# Patient Record
Sex: Male | Born: 1970 | Race: White | Hispanic: No | Marital: Married | Smoking: Current some day smoker
Health system: Southern US, Community
[De-identification: ages and names within clinical notes are randomized; demographics above are authoritative.]

## PROBLEM LIST (undated history)

## (undated) DIAGNOSIS — I471 Supraventricular tachycardia, unspecified: Secondary | ICD-10-CM

## (undated) DIAGNOSIS — I10 Essential (primary) hypertension: Secondary | ICD-10-CM

## (undated) DIAGNOSIS — E785 Hyperlipidemia, unspecified: Secondary | ICD-10-CM

## (undated) HISTORY — PX: VASECTOMY: SHX75

---

## 2007-12-28 ENCOUNTER — Emergency Department (HOSPITAL_COMMUNITY): Admission: EM | Admit: 2007-12-28 | Discharge: 2007-12-28 | Payer: Self-pay | Admitting: Emergency Medicine

## 2011-02-19 ENCOUNTER — Other Ambulatory Visit: Payer: Self-pay | Admitting: Urology

## 2011-02-19 ENCOUNTER — Ambulatory Visit (HOSPITAL_BASED_OUTPATIENT_CLINIC_OR_DEPARTMENT_OTHER)
Admission: RE | Admit: 2011-02-19 | Discharge: 2011-02-19 | Disposition: A | Discharge status: Home / Self Care | Payer: PRIVATE HEALTH INSURANCE | Source: Ambulatory Visit | Attending: Urology | Admitting: Urology

## 2011-02-19 DIAGNOSIS — Z302 Encounter for sterilization: Secondary | ICD-10-CM | POA: Insufficient documentation

## 2011-02-19 DIAGNOSIS — Z01812 Encounter for preprocedural laboratory examination: Secondary | ICD-10-CM | POA: Insufficient documentation

## 2011-02-19 LAB — POCT HEMOGLOBIN-HEMACUE: Hemoglobin: 15.2 g/dL (ref 13.0–17.0)

## 2011-02-24 NOTE — Op Note (Signed)
  NAMEMYKELL, RAWL NO.:  [id number]  MEDICAL RECORD NO.:  [id number]  LOCATION:                                 FACILITY:  PHYSICIAN:  Tanish Sinkler I. Patsi Sears, M.D. DATE OF BIRTH:  DATE OF PROCEDURE: DATE OF DISCHARGE:                              OPERATIVE REPORT   PREOPERATIVE DIAGNOSIS:  Elective sterilization.  POSTOPERATIVE DIAGNOSIS:  Elective sterilization.  OPERATION:  Vasectomy.  SURGEON:  Savanah Bayles I. Patsi Sears, MD  ANESTHESIA:  General LMA.  PREOPERATION PREPARATION:  After appropriate preanesthesia, the patient was brought to the operating room, placed on the operating room in dorsal supine position where general LMA anesthesia was induced.  He remained in supine position, with his scrotum was shaved, prepped with Betadine solution and draped in usual fashion.  REVIEW OF HISTORY:  This 40 year old male has a history of 5 children, he and his wife desire vasectomy for more permanent form of birth control.  Exam in the office showed a difficult, but normal scrotal anatomy, with a very thick spermatic cord, the vas buried within the spermatic cord.  PROCEDURE IN DETAIL:  The vas was isolated bilaterally and a 1.5 cm incision was made over the vas.  Subcutaneous tissue was dissected, the vas was then isolated, doubly clamped, and a portion removed.  Each end was then cauterized and ligated with 3-0 Vicryl suture.  The wounds were closed in single layer with 5-0 Monocryl suture.  The patient tolerated procedure well.  Marcaine was injected into each wound and Dermabond was used to seal the wound.  Dressing was applied.  The patient was awakened and taken to recovery room in good condition.     Elmus Mathes I. Patsi Sears, M.D.     SIT/MEDQ  D:  02/19/2011  T:  02/19/2011  Job:  [id number]  Electronically Signed by Jethro Bolus M.D. on 02/24/2011 05:01:45 PM

## 2019-10-29 ENCOUNTER — Observation Stay (HOSPITAL_COMMUNITY)
Admission: EM | Admit: 2019-10-29 | Discharge: 2019-10-31 | Disposition: A | Discharge status: Home / Self Care | Payer: Managed Care, Other (non HMO) | Attending: Internal Medicine | Admitting: Internal Medicine

## 2019-10-29 ENCOUNTER — Emergency Department (HOSPITAL_COMMUNITY): Payer: Managed Care, Other (non HMO)

## 2019-10-29 ENCOUNTER — Other Ambulatory Visit: Payer: Self-pay

## 2019-10-29 DIAGNOSIS — I471 Supraventricular tachycardia, unspecified: Secondary | ICD-10-CM

## 2019-10-29 DIAGNOSIS — E1165 Type 2 diabetes mellitus with hyperglycemia: Secondary | ICD-10-CM | POA: Insufficient documentation

## 2019-10-29 DIAGNOSIS — F17290 Nicotine dependence, other tobacco product, uncomplicated: Secondary | ICD-10-CM | POA: Insufficient documentation

## 2019-10-29 DIAGNOSIS — Z6841 Body Mass Index (BMI) 40.0 and over, adult: Secondary | ICD-10-CM | POA: Insufficient documentation

## 2019-10-29 DIAGNOSIS — I119 Hypertensive heart disease without heart failure: Secondary | ICD-10-CM | POA: Diagnosis not present

## 2019-10-29 DIAGNOSIS — I2510 Atherosclerotic heart disease of native coronary artery without angina pectoris: Secondary | ICD-10-CM | POA: Diagnosis not present

## 2019-10-29 DIAGNOSIS — I4510 Unspecified right bundle-branch block: Secondary | ICD-10-CM | POA: Diagnosis not present

## 2019-10-29 DIAGNOSIS — Z7901 Long term (current) use of anticoagulants: Secondary | ICD-10-CM | POA: Diagnosis not present

## 2019-10-29 DIAGNOSIS — R0609 Other forms of dyspnea: Secondary | ICD-10-CM | POA: Diagnosis present

## 2019-10-29 DIAGNOSIS — Z833 Family history of diabetes mellitus: Secondary | ICD-10-CM | POA: Diagnosis not present

## 2019-10-29 DIAGNOSIS — R739 Hyperglycemia, unspecified: Secondary | ICD-10-CM | POA: Diagnosis present

## 2019-10-29 DIAGNOSIS — J9601 Acute respiratory failure with hypoxia: Secondary | ICD-10-CM | POA: Diagnosis not present

## 2019-10-29 DIAGNOSIS — Z20822 Contact with and (suspected) exposure to covid-19: Secondary | ICD-10-CM | POA: Insufficient documentation

## 2019-10-29 DIAGNOSIS — G4733 Obstructive sleep apnea (adult) (pediatric): Secondary | ICD-10-CM

## 2019-10-29 DIAGNOSIS — Z8249 Family history of ischemic heart disease and other diseases of the circulatory system: Secondary | ICD-10-CM | POA: Diagnosis not present

## 2019-10-29 DIAGNOSIS — R079 Chest pain, unspecified: Secondary | ICD-10-CM | POA: Diagnosis present

## 2019-10-29 DIAGNOSIS — Z7982 Long term (current) use of aspirin: Secondary | ICD-10-CM | POA: Insufficient documentation

## 2019-10-29 DIAGNOSIS — Z79899 Other long term (current) drug therapy: Secondary | ICD-10-CM | POA: Insufficient documentation

## 2019-10-29 HISTORY — DX: Supraventricular tachycardia, unspecified: I47.10

## 2019-10-29 HISTORY — DX: Supraventricular tachycardia: I47.1

## 2019-10-29 HISTORY — DX: Essential (primary) hypertension: I10

## 2019-10-29 LAB — BASIC METABOLIC PANEL
Anion gap: 13 (ref 5–15)
BUN: 17 mg/dL (ref 6–20)
CO2: 19 mmol/L — ABNORMAL LOW (ref 22–32)
Calcium: 9.2 mg/dL (ref 8.9–10.3)
Chloride: 106 mmol/L (ref 98–111)
Creatinine, Ser: 1.15 mg/dL (ref 0.61–1.24)
GFR calc Af Amer: >60 mL/min (ref >60)
GFR calc non Af Amer: >60 mL/min (ref >60)
Glucose, Bld: 241 mg/dL — ABNORMAL HIGH (ref 70–99)
Potassium: 3.4 mmol/L — ABNORMAL LOW (ref 3.5–5.1)
Sodium: 138 mmol/L (ref 135–145)

## 2019-10-29 LAB — CBC
HCT: 39.2 % (ref 39.0–52.0)
Hemoglobin: 13.5 g/dL (ref 13.0–17.0)
MCH: 31.3 pg (ref 26.0–34.0)
MCHC: 34.4 g/dL (ref 30.0–36.0)
MCV: 90.7 fL (ref 80.0–100.0)
Platelets: 239 10*3/uL (ref 150–400)
RBC: 4.32 MIL/uL (ref 4.22–5.81)
RDW: 13 % (ref 11.5–15.5)
WBC: 8.4 10*3/uL (ref 4.0–10.5)
nRBC: 0 % (ref 0.0–0.2)

## 2019-10-29 LAB — TROPONIN I (HIGH SENSITIVITY): Troponin I (High Sensitivity): 8 ng/L (ref <18)

## 2019-10-29 NOTE — ED Triage Notes (Addendum)
Pt c/o intermittent CP for "a little while". Was dx with a right bundle branch block. States the CP is worse today with increased shortness of breath.

## 2019-10-30 ENCOUNTER — Encounter (HOSPITAL_COMMUNITY): Payer: Self-pay | Admitting: Internal Medicine

## 2019-10-30 ENCOUNTER — Ambulatory Visit (HOSPITAL_BASED_OUTPATIENT_CLINIC_OR_DEPARTMENT_OTHER): Payer: Managed Care, Other (non HMO)

## 2019-10-30 DIAGNOSIS — R079 Chest pain, unspecified: Secondary | ICD-10-CM

## 2019-10-30 DIAGNOSIS — G4733 Obstructive sleep apnea (adult) (pediatric): Secondary | ICD-10-CM | POA: Diagnosis not present

## 2019-10-30 DIAGNOSIS — R0600 Dyspnea, unspecified: Secondary | ICD-10-CM

## 2019-10-30 DIAGNOSIS — R0609 Other forms of dyspnea: Secondary | ICD-10-CM | POA: Diagnosis present

## 2019-10-30 DIAGNOSIS — R739 Hyperglycemia, unspecified: Secondary | ICD-10-CM

## 2019-10-30 DIAGNOSIS — I471 Supraventricular tachycardia: Secondary | ICD-10-CM | POA: Diagnosis not present

## 2019-10-30 DIAGNOSIS — I2510 Atherosclerotic heart disease of native coronary artery without angina pectoris: Secondary | ICD-10-CM

## 2019-10-30 HISTORY — PX: CARDIAC CATHETERIZATION: SHX172

## 2019-10-30 HISTORY — DX: Atherosclerotic heart disease of native coronary artery without angina pectoris: I25.10

## 2019-10-30 LAB — ECHOCARDIOGRAM COMPLETE
Height: 76 in
Weight: 6000 oz

## 2019-10-30 LAB — LIPID PANEL
Cholesterol: 146 mg/dL (ref 0–200)
HDL: 28 mg/dL — ABNORMAL LOW (ref >40)
LDL Cholesterol: 102 mg/dL — ABNORMAL HIGH (ref 0–99)
Total CHOL/HDL Ratio: 5.2 RATIO
Triglycerides: 81 mg/dL (ref <150)
VLDL: 16 mg/dL (ref 0–40)

## 2019-10-30 LAB — BRAIN NATRIURETIC PEPTIDE: B Natriuretic Peptide: 14.9 pg/mL (ref 0.0–100.0)

## 2019-10-30 LAB — CBC
HCT: 38.1 % — ABNORMAL LOW (ref 39.0–52.0)
Hemoglobin: 12.6 g/dL — ABNORMAL LOW (ref 13.0–17.0)
MCH: 30.7 pg (ref 26.0–34.0)
MCHC: 33.1 g/dL (ref 30.0–36.0)
MCV: 92.9 fL (ref 80.0–100.0)
Platelets: 192 10*3/uL (ref 150–400)
RBC: 4.1 MIL/uL — ABNORMAL LOW (ref 4.22–5.81)
RDW: 13 % (ref 11.5–15.5)
WBC: 7.4 10*3/uL (ref 4.0–10.5)
nRBC: 0 % (ref 0.0–0.2)

## 2019-10-30 LAB — GLUCOSE, CAPILLARY
Glucose-Capillary: 86 mg/dL (ref 70–99)
Glucose-Capillary: 137 mg/dL — ABNORMAL HIGH (ref 70–99)

## 2019-10-30 LAB — HIV ANTIBODY (ROUTINE TESTING W REFLEX): HIV Screen 4th Generation wRfx: NONREACTIVE

## 2019-10-30 LAB — CREATININE, SERUM
Creatinine, Ser: 0.98 mg/dL (ref 0.61–1.24)
GFR calc Af Amer: >60 mL/min (ref >60)
GFR calc non Af Amer: >60 mL/min (ref >60)

## 2019-10-30 LAB — HEMOGLOBIN A1C
Hgb A1c MFr Bld: 6 % — ABNORMAL HIGH (ref 4.8–5.6)
Mean Plasma Glucose: 125.5 mg/dL

## 2019-10-30 LAB — D-DIMER, QUANTITATIVE: D-Dimer, Quant: <0.27 ug/mL-FEU (ref 0.00–0.50)

## 2019-10-30 LAB — TROPONIN I (HIGH SENSITIVITY): Troponin I (High Sensitivity): 7 ng/L (ref <18)

## 2019-10-30 LAB — CBG MONITORING, ED
Glucose-Capillary: 106 mg/dL — ABNORMAL HIGH (ref 70–99)
Glucose-Capillary: 84 mg/dL (ref 70–99)

## 2019-10-30 LAB — POC SARS CORONAVIRUS 2 AG -  ED: SARS Coronavirus 2 Ag: NEGATIVE

## 2019-10-30 LAB — SARS CORONAVIRUS 2 (TAT 6-24 HRS): SARS Coronavirus 2: NEGATIVE

## 2019-10-30 MED ORDER — ACETAMINOPHEN 325 MG PO TABS: 650.0000 mg | ORAL_TABLET | ORAL | Status: DC | PRN

## 2019-10-30 MED ORDER — INSULIN ASPART 100 UNIT/ML ~~LOC~~ SOLN: 0.0000 [IU] | Freq: Every day | SUBCUTANEOUS | Status: DC

## 2019-10-30 MED ORDER — MULTIVITAMINS PO CAPS: 1.0000 | ORAL_CAPSULE | Freq: Every day | ORAL | Status: DC

## 2019-10-30 MED ORDER — METOPROLOL TARTRATE 25 MG PO TABS: 25.0000 mg | ORAL_TABLET | Freq: Two times a day (BID) | ORAL | Status: DC

## 2019-10-30 MED ORDER — LISINOPRIL 20 MG PO TABS
20.0000 mg | ORAL_TABLET | Freq: Every day | ORAL | Status: DC
  Administered 2019-10-30 – 2019-10-31 (×2): 20 mg via ORAL
  Filled 2019-10-30 (×2): qty 1

## 2019-10-30 MED ORDER — ENOXAPARIN SODIUM 40 MG/0.4ML ~~LOC~~ SOLN
40.0000 mg | SUBCUTANEOUS | Status: DC
  Administered 2019-10-30: 40 mg via SUBCUTANEOUS
  Filled 2019-10-30: qty 0.4

## 2019-10-30 MED ORDER — DICLOFENAC SODIUM 75 MG PO TBEC: 75.0000 mg | DELAYED_RELEASE_TABLET | Freq: Two times a day (BID) | ORAL | Status: DC | PRN

## 2019-10-30 MED ORDER — IBUPROFEN 400 MG PO TABS: 400.0000 mg | ORAL_TABLET | Freq: Four times a day (QID) | ORAL | Status: DC | PRN

## 2019-10-30 MED ORDER — METOPROLOL TARTRATE 25 MG PO TABS
25.0000 mg | ORAL_TABLET | Freq: Two times a day (BID) | ORAL | Status: AC
  Administered 2019-10-30 – 2019-10-31 (×2): 25 mg via ORAL
  Filled 2019-10-30 (×2): qty 1

## 2019-10-30 MED ORDER — SODIUM CHLORIDE 0.9 % IV BOLUS
1000.0000 mL | Freq: Once | INTRAVENOUS | Status: AC
  Administered 2019-10-30: 01:00:00 1000 mL via INTRAVENOUS

## 2019-10-30 MED ORDER — INSULIN ASPART 100 UNIT/ML ~~LOC~~ SOLN: 0.0000 [IU] | Freq: Three times a day (TID) | SUBCUTANEOUS | Status: DC

## 2019-10-30 MED ORDER — ONDANSETRON HCL 4 MG/2ML IJ SOLN: 4.0000 mg | Freq: Four times a day (QID) | INTRAMUSCULAR | Status: DC | PRN

## 2019-10-30 MED ORDER — ASPIRIN-ACETAMINOPHEN-CAFFEINE 250-250-65 MG PO TABS
1.0000 | ORAL_TABLET | Freq: Four times a day (QID) | ORAL | Status: DC | PRN
  Filled 2019-10-30: qty 1

## 2019-10-30 MED ORDER — ADULT MULTIVITAMIN W/MINERALS CH
1.0000 | ORAL_TABLET | Freq: Every day | ORAL | Status: DC
  Administered 2019-10-30 – 2019-10-31 (×2): 1 via ORAL
  Filled 2019-10-30 (×2): qty 1

## 2019-10-30 MED ORDER — HYDROCHLOROTHIAZIDE 25 MG PO TABS
25.0000 mg | ORAL_TABLET | Freq: Every day | ORAL | Status: DC
  Administered 2019-10-30 – 2019-10-31 (×2): 25 mg via ORAL
  Filled 2019-10-30 (×2): qty 1

## 2019-10-30 MED ORDER — LISINOPRIL-HYDROCHLOROTHIAZIDE 20-25 MG PO TABS: 1.0000 | ORAL_TABLET | Freq: Every day | ORAL | Status: DC

## 2019-10-30 MED ORDER — AMLODIPINE BESYLATE 5 MG PO TABS
5.0000 mg | ORAL_TABLET | Freq: Every day | ORAL | Status: DC
  Administered 2019-10-30 – 2019-10-31 (×2): 5 mg via ORAL
  Filled 2019-10-30 (×2): qty 1

## 2019-10-30 MED ORDER — HEPARIN SODIUM (PORCINE) 5000 UNIT/ML IJ SOLN
5000.0000 [IU] | Freq: Three times a day (TID) | INTRAMUSCULAR | Status: DC
  Administered 2019-10-30: 5000 [IU] via SUBCUTANEOUS
  Filled 2019-10-30: qty 1

## 2019-10-30 MED ORDER — NITROGLYCERIN 0.4 MG SL SUBL: 0.4000 mg | SUBLINGUAL_TABLET | SUBLINGUAL | Status: DC | PRN

## 2019-10-30 NOTE — ED Notes (Signed)
Dr Elesa Massed informed pt POC covid test neg

## 2019-10-30 NOTE — Progress Notes (Signed)
PROGRESS NOTE    Edward Obrien  XFG:[id number] DOB: 16-Jan-1971 DOA: 10/29/2019 PCP: Eartha Inch, MD    Brief Narrative:  Patient admitted with the working diagnosis of chest pain and dyspnea to rule out acute coronary disease.   49 year old male who presented with chest pain and dyspnea on exertion.  He does have significant past medical history for hypertension, obesity and obstructive sleep apnea. Recently diagnosed with SVT. Patient reported worsening of precordial chest pain, pressure-like in nature, worse with exertion, improved with rest.  Lately has have been at rest as well.  His chest pain is associated with dyspnea, diaphoresis and nausea.  Patient was seen as an outpatient and scheduled to have outpatient stress test ([facility]). On his initial physical examination his temperature 98.4, blood pressure 106/66, heart rate 80, respiratory 24, oxygen saturation 98%.  Heart S1-S2 present, rhythmic, lungs clear to auscultation, abdomen soft, no lower extremity edema.  Sodium 138, potassium 3.4, chloride 106, bicarb 19, glucose 241, creatinine 1.15, troponin I 8, white count 8.4, hemoglobin 13.5, hematocrit 39.2, platelets 239.  EKG 86 bpm, normal axis, right bundle branch block, manually QTc 487, sinus rhythm with PAC, no significant ST segment or T wave changes.   Assessment & Plan:   Principal Problem:   Chest pain Active Problems:   Exertional dyspnea   Hyperglycemia   1. Chest pain. Patient with dyspnea on exertion and precordial chest pain. His EKG has no active ischemic changes, (chronic right bundle branch block), old records personally reviewed, follows with cardiology at [facility], he has been diagnosed with SVT.   His troponin I are negative and d dimer is less than 0.27  Patient will benefit from stress test during this hospitalization, considering with worsening symptoms of dyspnea and chest pain. I have called cardiology for evaluation. Continue telemetry monitoring, he  will benefit from b blockade to prevent tachyarrhythmias.   Check lipid profile.   2. T2DM. Hgb A1c 6,0. Will continue glucose cover and monitoring with insulin sliding scale. Will advance his diet for now.  3. HTN. Continue amlodipine, lisinopril and Hctz for blood pressure control.   4, Obesity. OSA. Patient will need to continue outpatient follow up, Cpap therapy.     DVT prophylaxis: Enoxaparin  Code Status:   full  Family Communication:  I spoke with patient's wife at the bedside, we talked in detail about patient's condition, plan of care and prognosis and all questions were addressed.   Disposition Plan:   Patient is from:  Home   Anticipated DC to:  Home   Anticipated DC date:  04/27  Anticipated DC barriers: Possible stress test      Consultants:   Cardiology   Subjective: Patient with no chest pain, no nausea or vomiting, no dyspnea. Has been npo since admission.   Objective: Vitals:   10/30/19 1200 10/30/19 1230 10/30/19 1300 10/30/19 1330  BP: 116/68 113/68 125/74 111/68  Pulse: 72 73 68 62  Resp: 15 20 19 19   Temp:      TempSrc:      SpO2: 98% 98% 97% 90%  Weight:      Height:        Intake/Output Summary (Last 24 hours) at 10/30/2019 1459 Last data filed at 10/29/2019 2247 Gross per 24 hour  Intake --  Output 650 ml  Net -650 ml   Filed Weights   10/29/19 2234  Weight: (!) 170.1 kg    Examination:   General: Not in pain or dyspnea,  Neurology: Awake and alert, non focal  E ENT: no pallor, no icterus, oral mucosa moist Cardiovascular: No JVD. S1-S2 present, rhythmic, no gallops, rubs, or murmurs. No lower extremity edema. Pulmonary: positive breath sounds bilaterally, adequate air movement, no wheezing, rhonchi or rales. Gastrointestinal. Abdomen with no organomegaly, non tender, no rebound or guarding Skin. No rashes Musculoskeletal: no joint deformities     Data Reviewed: I have personally reviewed following labs and imaging  studies  CBC: Recent Labs  Lab 10/29/19 2200 10/30/19 0408  WBC 8.4 7.4  HGB 13.5 12.6*  HCT 39.2 38.1*  MCV 90.7 92.9  PLT 239 161   Basic Metabolic Panel: Recent Labs  Lab 10/29/19 2200 10/30/19 0408  NA 138  --   K 3.4*  --   CL 106  --   CO2 19*  --   GLUCOSE 241*  --   BUN 17  --   CREATININE 1.15 0.98  CALCIUM 9.2  --    GFR: Estimated Creatinine Clearance: 153.2 mL/min (by C-G formula based on SCr of 0.98 mg/dL). Liver Function Tests: No results for input(s): AST, ALT, ALKPHOS, BILITOT, PROT, ALBUMIN in the last 168 hours. No results for input(s): LIPASE, AMYLASE in the last 168 hours. No results for input(s): AMMONIA in the last 168 hours. Coagulation Profile: No results for input(s): INR, PROTIME in the last 168 hours. Cardiac Enzymes: No results for input(s): CKTOTAL, CKMB, CKMBINDEX, TROPONINI in the last 168 hours. BNP (last 3 results) No results for input(s): PROBNP in the last 8760 hours. HbA1C: Recent Labs    10/30/19 0611  HGBA1C 6.0*   CBG: Recent Labs  Lab 10/30/19 0758 10/30/19 1351  GLUCAP 106* 84   Lipid Profile: Recent Labs    10/30/19 0408  CHOL 146  HDL 28*  LDLCALC 102*  TRIG 81  CHOLHDL 5.2   Thyroid Function Tests: No results for input(s): TSH, T4TOTAL, FREET4, T3FREE, THYROIDAB in the last 72 hours. Anemia Panel: No results for input(s): VITAMINB12, FOLATE, FERRITIN, TIBC, IRON, RETICCTPCT in the last 72 hours.    Radiology Studies: I have reviewed all of the imaging during this hospital visit personally     Scheduled Meds: . heparin  5,000 Units Subcutaneous Q8H  . insulin aspart  0-5 Units Subcutaneous QHS  . insulin aspart  0-9 Units Subcutaneous TID WC   Continuous Infusions:   LOS: 0 days        Chloe Baig Gerome Apley, MD

## 2019-10-30 NOTE — ED Provider Notes (Signed)
[facility] EMERGENCY DEPARTMENT Provider Note   CSN: [id number] Arrival date & time: 10/29/19  2127     History Chief Complaint  Patient presents with  . Chest Pain    Edward Obrien is a 49 y.o. male with history of OSA, hypertension, obesity presents for evaluation of acute onset, progressively worsening shortness of breath for 3 days.  He has noted significant dyspnea on exertion especially over the last 2 days with minimal activity including with sitting on his lawnmower and going up a flight of steps.  He has noted sharp left-sided chest pains that will last for a few seconds at a time.  He has noted nausea, diaphoresis.  No aggravating or alleviating factors.  He reports that he is experienced similar chest pains previously maybe once or twice a month and typically associated with stress but states he has never experienced this chest pain multiple times in 1 day.  Wife is also noted a cough that has been nonproductive over the last 2 weeks.  No known sick contacts or Covid exposures.  No fevers.  He has a history of OSA and is and is in the process of being set up for CPAP.  She states that last night she noticed the patient "sounded like he was drowning" and had significant difficulty sleeping.  He denies leg swelling.  He infrequently smokes cigars, denies recreational drug use, no known family history of heart disease in first-degree relatives under the age of 55.  He was recently seen by cardiology through the [facility] system after it was noted that he had an arrhythmia while sedated for a colonoscopy.  He establish care with cardiology on 10/17/2019 and noted to have a right bundle blanch block.  He is in the process of being set up for an outpatient stress test and echocardiogram.  The history is provided by the patient.       No past medical history on file.  Patient Active Problem List   Diagnosis Date Noted  . Chest pain 10/30/2019       No family  history on file.  Social History   Tobacco Use  . Smoking status: Not on file  Substance Use Topics  . Alcohol use: Not on file  . Drug use: Not on file    Home Medications Prior to Admission medications   Not on File    Allergies    Patient has no known allergies.  Review of Systems   Review of Systems  Constitutional: Positive for diaphoresis and fatigue. Negative for chills and fever.  Respiratory: Positive for shortness of breath.   Cardiovascular: Positive for chest pain. Negative for leg swelling.  Gastrointestinal: Positive for nausea. Negative for abdominal pain and vomiting.  Neurological: Positive for light-headedness.  All other systems reviewed and are negative.   Physical Exam Updated Vital Signs BP (!) 146/118   Pulse 80   Temp 99.5 F (37.5 C) (Rectal)   Resp 18   Ht 6\' 4"  (1.93 m)   Wt (!) 170.1 kg   SpO2 97%   BMI 45.65 kg/m   Physical Exam Vitals and nursing note reviewed.  Constitutional:      General: He is not in acute distress.    Appearance: He is well-developed. He is obese.  HENT:     Head: Normocephalic and atraumatic.  Eyes:     General:        Right eye: No discharge.  Left eye: No discharge.     Conjunctiva/sclera: Conjunctivae normal.  Neck:     Vascular: No JVD.     Trachea: No tracheal deviation.  Cardiovascular:     Rate and Rhythm: Normal rate and regular rhythm.     Comments: 2+ radial and DP/PT pulses bilaterally, Homans sign absent bilaterally, no lower extremity edema, no palpable cords, compartments are soft  Pulmonary:     Effort: Tachypnea present.     Comments: Speaking in very short phrases.  Frequent nonproductive cough.  SPO2 saturations mostly 93-97% on room air with occasional desaturations down to the 80s while sitting upright.  Abdominal:     General: There is no distension.     Palpations: Abdomen is soft.     Tenderness: There is no abdominal tenderness.  Musculoskeletal:     Cervical back:  Normal range of motion and neck supple.     Right lower leg: No tenderness. No edema.     Left lower leg: No tenderness. No edema.  Skin:    General: Skin is warm and dry.     Findings: No erythema.  Neurological:     Mental Status: He is alert.  Psychiatric:        Behavior: Behavior normal.     ED Results / Procedures / Treatments   Labs (all labs ordered are listed, but only abnormal results are displayed) Labs Reviewed  BASIC METABOLIC PANEL - Abnormal; Notable for the following components:      Result Value   Potassium 3.4 (*)    CO2 19 (*)    Glucose, Bld 241 (*)    All other components within normal limits  SARS CORONAVIRUS 2 (TAT 6-24 HRS)  CBC  BRAIN NATRIURETIC PEPTIDE  D-DIMER, QUANTITATIVE (NOT AT [facility])  HIV ANTIBODY (ROUTINE TESTING W REFLEX)  CBC  CREATININE, SERUM  LIPID PANEL  POC SARS CORONAVIRUS 2 AG -  ED  TROPONIN I (HIGH SENSITIVITY)  TROPONIN I (HIGH SENSITIVITY)    EKG EKG Interpretation  Date/Time:  Sunday October 29 2019 21:41:47 EDT Ventricular Rate:  86 PR Interval:  172 QRS Duration: 122 QT Interval:  456 QTC Calculation: 545 R Axis:   106 Text Interpretation: Sinus rhythm with occasional Premature ventricular complexes Right bundle branch block Abnormal ECG No old tracing to compare Confirmed by Ward, Cyril Mourning [phone]) on 10/30/2019 12:04:29 AM   Radiology DG Chest 2 View  Result Date: 10/29/2019 CLINICAL DATA:  Chest pain EXAM: CHEST - 2 VIEW COMPARISON:  None. FINDINGS: The heart size and mediastinal contours are within normal limits. Both lungs are clear. The visualized skeletal structures are unremarkable. IMPRESSION: No active cardiopulmonary disease. Electronically Signed   By: Randa Ngo M.D.   On: 10/29/2019 22:23    Procedures .Critical Care Performed by: Renita Papa, PA-C Authorized by: Renita Papa, PA-C   Critical care provider statement:    Critical care time (minutes):  45   Critical care was necessary to treat  or prevent imminent or life-threatening deterioration of the following conditions:  Respiratory failure   Critical care was time spent personally by me on the following activities:  Discussions with consultants, evaluation of patient's response to treatment, examination of patient, ordering and performing treatments and interventions, ordering and review of laboratory studies, ordering and review of radiographic studies, pulse oximetry, re-evaluation of patient's condition, obtaining history from patient or surrogate and review of old charts   (including critical care time)  Medications Ordered in ED Medications  acetaminophen (TYLENOL) tablet 650 mg (has no administration in time range)  ondansetron (ZOFRAN) injection 4 mg (has no administration in time range)  heparin injection 5,000 Units (has no administration in time range)  sodium chloride 0.9 % bolus 1,000 mL (1,000 mLs Intravenous Bolus from Bag 10/30/19 0127)    ED Course  I have reviewed the triage vital signs and the nursing notes.  Pertinent labs & imaging results that were available during my care of the patient were reviewed by me and considered in my medical decision making (see chart for details).    MDM Rules/Calculators/A&P                      Edward Obrien was evaluated in Emergency Department on 10/30/2019 for the symptoms described in the history of present illness. He was evaluated in the context of the global COVID-19 pandemic, which necessitated consideration that the patient might be at risk for infection with the SARS-CoV-2 virus that causes COVID-19. Institutional protocols and algorithms that pertain to the evaluation of patients at risk for COVID-19 are in a state of rapid change based on information released by regulatory bodies including the CDC and federal and state organizations. These policies and algorithms were followed during the patient's care in the ED.  Patient presenting for evaluation of shortness of  breath, dyspnea on exertion, chest pains.  He is afebrile, tachypneic with visibly increased work of breathing on initial evaluation.  Lungs clear to auscultation bilaterally though somewhat diminished due to body habitus.  He will occasionally desaturate to the mid to low 80s while sitting upright.  He was placed on supplemental oxygen with improvement.   EKG shows right bundle branch block which was recently identified and evaluated by cardiology through the [place].  Serial troponins are negative.  Chest x-ray shows no acute cardiopulmonary abnormalities.  D-dimer is negative, reassuring that he likely does not have a PE.  Remainder of lab work reviewed by me shows no leukocytosis, no anemia, no renal insufficiency.  He is mildly hypokalemic and CO2 is also a little bit low.  He is hyperglycemic but no evidence of DKA.   Rectal temperature was obtained which was low-grade 99.5 F.  Point-of-care Covid test is negative.  He is vaccinated against Covid and has had no sick contacts.  He was ambulated in the ED and was noted to be lightheaded.  Orthostatic vital signs were obtained, with blood pressure 89/61 with standing.  Will give IV fluids and reassess.   With his transient hypoxia, new onset dyspnea on exertion, he would benefit from admission to the hospital.  Spoke with Dr. Lennette Bihari with [facility] hospitalist service who agrees to assume care of patient and bring him to the hospital for further evaluation and management. Patient seen and evaluated by Dr. Elesa Massed who agrees with assessment and plan at this time.    Final Clinical Impression(s) / ED Diagnoses Final diagnoses:  Acute respiratory failure with hypoxia [facility])    Rx / DC Orders ED Discharge Orders    None       Jeanie Sewer, PA-C 10/30/19 0405    Ward, Layla Maw, DO 10/30/19 0411

## 2019-10-30 NOTE — Progress Notes (Addendum)
Cardiology Consultation:   Patient ID: Edward Obrien MRN: [id number]; DOB: Dec 02, 1970  Admit date: 10/29/2019 Date of Consult: 10/30/2019  Primary Care Provider: Eartha Inch, MD Primary Cardiologist: No primary care provider on file. new here followed by Dr. Boneta Lucks with [facility]   Primary Electrophysiologist:  None    Patient Profile:   Edward Obrien is a 49 y.o. male with a hx of HTN, obesity, OSA, and new SVT who is being seen today for the evaluation of chest pain at the request of Dr. Ella Jubilee.  History of Present Illness:   Edward Obrien with above hx was seen by cardiology 10/17/19 after irregular rhythm on GI procedure.   EKG with RBBB, + tobacco use with cigars.  Was to have echo stress test but not yet done.    48 hr holter. Minimum heart rate 53 bpm.  Maximum heart rate 203 bpm, supraventricular tachycardia.  There were premature atrial contractions with an 8 beat run of supraventricular tachycardia at a rate of 203 bpm.  There were no pauses greater than 3 seconds in duration.  Now presents with chest pain and increasing DOE.  He had been having chest pain about twice a month but no several episodes in a day to 2 days.  Initially pain was with exertion now with rest.   He has not been set up for CPAP.  Admitted 10/29/19     Pain is sharp stabbing pain like a needle sticking in chest. Occurred rarely but last 2 days frequently  More than the chest pain is DOE, he has been walking up to 3 miles but over last few days cannot do much.  Walking up stairs he had to stop to catch breath, + significant diaphoresis.  His wife states he is strong and does lot of work but this past week has been unable to do much.   He has been waking from sleep gasping for hour.   Echo here EF 55-60% G1 DD RV size mildly enlarged.  Mild dilatation or the aortic root. At 40 mm  EKG:  The EKG was personally reviewed and demonstrates:  SR with RBBB and Rt axis  Telemetry:  Telemetry was personally  reviewed and demonstrates:  No telemetry - I ordered. Na 138, K+ 3.4 BUN 17, Cr 1.15  Troponin 8 and 7  BNP 14.9 t chol. 146, HDL 28, LDL 102  Hgb 12.6 and Hct 38 ddimer <0.27  hgb A1c 6 COVID neg  2 V cxr No active cardiopulmonary disease.  Currently resting comfortably.  He and his wife are both concerned about change in his activity level.  The oxygen has helped.   Past Medical History:  Diagnosis Date  . HTN (hypertension)   . SVT (supraventricular tachycardia) (HCC)      Home Medications:  Prior to Admission medications   Medication Sig Start Date End Date Taking? Authorizing Provider  amLODipine (NORVASC) 5 MG tablet Take 5 mg by mouth daily. 10/18/19  Yes [provider]  aspirin-acetaminophen-caffeine (EXCEDRIN MIGRAINE) [phone] MG tablet Take 1 tablet by mouth every 6 (six) hours as needed for headache.   Yes [provider]  diclofenac (VOLTAREN) 75 MG EC tablet Take 75 mg by mouth 2 (two) times daily as needed for pain. 03/15/19 03/14/20 Yes [provider]  ibuprofen (ADVIL) 200 MG tablet Take 400 mg by mouth every 6 (six) hours as needed for moderate pain.   Yes [provider]  lisinopril-hydrochlorothiazide (ZESTORETIC) 20-25 MG tablet Take  1 tablet by mouth daily. 10/18/19  Yes [provider]  Multiple Vitamin (MULTIVITAMIN) capsule Take 1 capsule by mouth daily.   Yes [provider]    Inpatient Medications: Scheduled Meds: . amLODipine  5 mg Oral Daily  . enoxaparin (LOVENOX) injection  40 mg Subcutaneous Q24H  . lisinopril  20 mg Oral Daily   And  . hydrochlorothiazide  25 mg Oral Daily  . insulin aspart  0-5 Units Subcutaneous QHS  . insulin aspart  0-9 Units Subcutaneous TID WC  . multivitamin with minerals  1 tablet Oral Daily   Continuous Infusions:   PRN Meds: acetaminophen, aspirin-acetaminophen-caffeine, ibuprofen, nitroGLYCERIN, ondansetron (ZOFRAN) IV  Allergies:   No Known  Allergies  Social History:   Social History   Socioeconomic History  . Marital status: Married    Spouse name: Not on file  . Number of children: Not on file  . Years of education: Not on file  . Highest education level: Not on file  Occupational History  . Not on file  Tobacco Use  . Smoking status: Current Every Day Smoker    Types: Cigars  Substance and Sexual Activity  . Alcohol use: Not on file  . Drug use: Not on file  . Sexual activity: Not on file  Other Topics Concern  . Not on file  Social History Narrative  . Not on file   Social Determinants of Health   Financial Resource Strain:   . Difficulty of Paying Living Expenses:   Food Insecurity:   . Worried About Programme researcher, broadcasting/film/videounning Out of Food in the Last Year:   . Baristaan Out of Food in the Last Year:   Transportation Needs:   . Freight forwarderLack of Transportation (Medical):   Marland Kitchen. Lack of Transportation (Non-Medical):   Physical Activity:   . Days of Exercise per Week:   . Minutes of Exercise per Session:   Stress:   . Feeling of Stress :   Social Connections:   . Frequency of Communication with Friends and Family:   . Frequency of Social Gatherings with Friends and Family:   . Attends Religious Services:   . Active Member of Clubs or Organizations:   . Attends BankerClub or Organization Meetings:   Marland Kitchen. Marital Status:   Intimate Partner Violence:   . Fear of Current or Ex-Partner:   . Emotionally Abused:   Marland Kitchen. Physically Abused:   . Sexually Abused:     Family History:    Family History  Problem Relation Age of Onset  . Alzheimer's disease Mother   . Hypertension Mother   . Diabetes Mother   . Failure to thrive Father      ROS:  Please see the history of present illness.  General:no colds or fevers, no weight changes Skin:no rashes or ulcers HEENT:no blurred vision, no congestion CV:see HPI PUL:see HPI GI:no diarrhea constipation or melena, no indigestion GU:no hematuria, no dysuria MS:no joint pain, no claudication Neuro:no  syncope, no lightheadedness Endo:no diabetes, no thyroid disease  All other ROS reviewed and negative.     Physical Exam/Data:   Vitals:   10/30/19 1500 10/30/19 1530 10/30/19 1600 10/30/19 1636  BP: 124/72 121/73 113/72 117/88  Pulse: (!) 59 67 63 61  Resp: (!) 21 12 19 18   Temp:    97.6 F (36.4 C)  TempSrc:    Oral  SpO2: 96% 100% 97% 100%  Weight:      Height:        Intake/Output Summary (Last  24 hours) at 10/30/2019 1758 Last data filed at 10/29/2019 2247 Gross per 24 hour  Intake --  Output 650 ml  Net -650 ml   Last 3 Weights 10/29/2019  Weight (lbs) 375 lb  Weight (kg) 170.099 kg     Body mass index is 45.65 kg/m.  General:  Well nourished, well developed, in no acute distress  HEENT: normal Lymph: no adenopathy Neck: no JVD Endocrine:  No thryomegaly Vascular: No carotid bruits; pedal pulses 2+ bilaterally   Cardiac:  normal S1, S2; RRR; no murmur gallup rub or click Lungs:  clear to auscultation bilaterally, no wheezing, rhonchi or rales  Abd: soft, nontender, no hepatomegaly  Ext: no edema Musculoskeletal:  No deformities, BUE and BLE strength normal and equal Skin: warm and dry  Neuro:  Alert and oriented X 3 MAE follows commands, no focal abnormalities noted Psych:  Normal affect    Relevant CV Studies: Monitor see above  Laboratory Data:  High Sensitivity Troponin:   Recent Labs  Lab 10/29/19 2200 10/29/19 2348  TROPONINIHS 8 7     Chemistry Recent Labs  Lab 10/29/19 2200 10/30/19 0408  NA 138  --   K 3.4*  --   CL 106  --   CO2 19*  --   GLUCOSE 241*  --   BUN 17  --   CREATININE 1.15 0.98  CALCIUM 9.2  --   GFRNONAA >60 >60  GFRAA >60 >60  ANIONGAP 13  --     No results for input(s): PROT, ALBUMIN, AST, ALT, ALKPHOS, BILITOT in the last 168 hours. Hematology Recent Labs  Lab 10/29/19 2200 10/30/19 0408  WBC 8.4 7.4  RBC 4.32 4.10*  HGB 13.5 12.6*  HCT 39.2 38.1*  MCV 90.7 92.9  MCH 31.3 30.7  MCHC 34.4 33.1  RDW  13.0 13.0  PLT 239 192   BNP Recent Labs  Lab 10/30/19 0015  BNP 14.9    DDimer  Recent Labs  Lab 10/30/19 0015  DDIMER <0.27     Radiology/Studies:  DG Chest 2 View  Result Date: 10/29/2019 CLINICAL DATA:  Chest pain EXAM: CHEST - 2 VIEW COMPARISON:  None. FINDINGS: The heart size and mediastinal contours are within normal limits. Both lungs are clear. The visualized skeletal structures are unremarkable. IMPRESSION: No active cardiopulmonary disease. Electronically Signed   By: Sharlet Salina M.D.   On: 10/29/2019 22:23   ECHOCARDIOGRAM COMPLETE  Result Date: 10/30/2019    ECHOCARDIOGRAM REPORT   Patient Name:   Edward Obrien Date of Exam: 10/30/2019 Medical Rec #:  [id number]       Height:       76.0 in Accession #:    [id number]      Weight:       375.0 lb Date of Birth:  [birth year]        BSA:          2.894 m Patient Age:    48 years        BP:           108/69 mmHg Patient Gender: M               HR:           62 bpm. Exam Location:  Inpatient Procedure: 2D Echo, Cardiac Doppler and Color Doppler Indications:    R07.9* Chest pain, unspecified  History:        Patient has no prior history of Echocardiogram examinations.  Sonographer:  Elmarie Shiley Dance Referring Phys: [id number] [facility]  Sonographer Comments: Technically difficult study due to poor echo windows. IMPRESSIONS  1. Left ventricular ejection fraction, by estimation, is 55 to 60%. The left ventricle has normal function. Left ventricular endocardial border not optimally defined to evaluate regional wall motion. Left ventricular diastolic parameters are consistent with Grade I diastolic dysfunction (impaired relaxation).  2. Right ventricular systolic function is normal. The right ventricular size is mildly enlarged. Tricuspid regurgitation signal is inadequate for assessing PA pressure.  3. The mitral valve is normal in structure. Trivial mitral valve regurgitation. No evidence of mitral stenosis.  4. The aortic valve was not  well visualized. Aortic valve regurgitation is not visualized. No aortic stenosis is present.  5. Aortic dilatation noted. There is mild dilatation of the aortic root measuring 40 mm.  6. The inferior vena cava is dilated in size with <50% respiratory variability, suggesting right atrial pressure of 15 mmHg. FINDINGS  Left Ventricle: LV wall thickness not well visualized for measurement, appears grossly normal in other views. Left ventricular ejection fraction, by estimation, is 55 to 60%. The left ventricle has normal function. Left ventricular endocardial border not optimally defined to evaluate regional wall motion. The left ventricular internal cavity size was normal in size. Left ventricular diastolic parameters are consistent with Grade I diastolic dysfunction (impaired relaxation). Right Ventricle: The right ventricular size is mildly enlarged. No increase in right ventricular wall thickness. Right ventricular systolic function is normal. Tricuspid regurgitation signal is inadequate for assessing PA pressure. Left Atrium: Left atrial size was normal in size. Right Atrium: Right atrial size was normal in size. Pericardium: There is no evidence of pericardial effusion. Presence of pericardial fat pad. Mitral Valve: The mitral valve is normal in structure. Normal mobility of the mitral valve leaflets. Trivial mitral valve regurgitation. No evidence of mitral valve stenosis. Tricuspid Valve: The tricuspid valve is normal in structure. Tricuspid valve regurgitation is not demonstrated. No evidence of tricuspid stenosis. Aortic Valve: The aortic valve was not well visualized. Aortic valve regurgitation is not visualized. No aortic stenosis is present. Pulmonic Valve: The pulmonic valve was not well visualized. Pulmonic valve regurgitation is trivial. No evidence of pulmonic stenosis. Aorta: The aortic root was not well visualized and aortic dilatation noted. There is mild dilatation of the aortic root measuring 40  mm. Venous: The inferior vena cava was not well visualized. The inferior vena cava is dilated in size with less than 50% respiratory variability, suggesting right atrial pressure of 15 mmHg. IAS/Shunts: The interatrial septum was not well visualized.  LEFT VENTRICLE PLAX 2D LVIDd:         5.13 cm  Diastology LVIDs:         4.58 cm  LV e' lateral:   11.10 cm/s LVOT diam:     2.40 cm  LV E/e' lateral: 5.5 LV SV:         79       LV e' medial:    6.64 cm/s LV SV Index:   27       LV E/e' medial:  9.3 LVOT Area:     4.52 cm  RIGHT VENTRICLE             IVC RV Basal diam:  3.86 cm     IVC diam: 2.54 cm RV Mid diam:    2.47 cm RV S prime:     12.10 cm/s TAPSE (M-mode): 2.1 cm LEFT ATRIUM  Index       RIGHT ATRIUM           Index LA diam:        4.10 cm 1.42 cm/m  RA Area:     25.00 cm LA Vol (A2C):   69.5 ml 24.02 ml/m RA Volume:   76.70 ml  26.51 ml/m LA Vol (A4C):   44.1 ml 15.24 ml/m LA Biplane Vol: 56.5 ml 19.53 ml/m  AORTIC VALVE LVOT Vmax:   85.00 cm/s LVOT Vmean:  54.500 cm/s LVOT VTI:    0.175 m  AORTA Ao Root diam: 4.00 cm Ao Asc diam:  3.40 cm MITRAL VALVE MV Area (PHT): 2.73 cm    SHUNTS MV Decel Time: 278 msec    Systemic VTI:  0.18 m MV E velocity: 61.60 cm/s  Systemic Diam: 2.40 cm MV A velocity: 67.40 cm/s MV E/A ratio:  0.91 Cherlynn Kaiser MD Electronically signed by Cherlynn Kaiser MD Signature Date/Time: 10/30/2019/4:25:55 PM    Final         No pain currently - atypical Assessment and Plan:   1. Chest pan with more freq episodes and occurs now at rest.  --was to have exercise echo but not yet done.  He did wear 48 hr holter with SVt though not sure if arrhythmia occurs with pain.  neg troponin  In 2014 he was given NSAIDS for chest wall pain.   Echo see above, will plan cardiac CTA in AM  Add lopressor 25 BID for test.  2. HTN on amlodipine and zestoretic at home  3. SVT on holter monitor  4. OSA not yet on CPAP  5. Tobacco use cigars, have recommended he stop.         For questions or updates, please contact [place] Please consult [email or web address] for contact info under    Signed, Cecilie Kicks, NP  10/30/2019 5:58 PM  I have seen and examined the patient along with Cecilie Kicks, NP , PA NP.  I have reviewed the chart, notes and new data.  I agree with PA/NP's note.  Key new complaints: has definite symptoms of OSA (witnessed apnea, restless sleep with sudden awakening and choking, loud snoring, daytime fatigue/hypersomnolence). Also describes more recent symptoms of CHF (PND, exertional and supine cough, ankle edema). Atypical chest pain. Key examination changes: severely obese, which limits the CV exam; mild ankle edema, otw normal CV exam Key new findings / data: normal LVEF, dilated right heart chambers, minimal signs of diastolic dysfunction on echo.  PLAN: Coronary CT angio to assess for CAD. Echo image quality is fair, but doubt stress echo will generate easily interpretable images. Similarly, nuclear perfusion may be difficult to interpret due to body habitus. Needs to f/u w CPAP titration for OSA. OSA related pulmonary HTN may explain many of his complaints and the proclivity to SVT.   Sanda Klein, MD, [place] [phone] 10/30/2019, 9:12 PM

## 2019-10-30 NOTE — H&P (Signed)
History and Physical    PASQUAL FARIAS NLZ:[id number] DOB: 01/24/1971 DOA: 10/29/2019  PCP: Eartha Inch, MD (Confirm with patient/family/NH records and if not entered, this has to be entered at [facility] point of entry)   Patient coming from: Home     I have personally briefly reviewed patient's old medical records in [facility]  Chief Complaint: Chest pain and dyspnea with exertion  HPI: [name] is a 49 y.o. male with medical history significant of hypertension, obesity and sleep apnea presented to ED for evaluation of chest pain and worsening dyspnea with exertion.  Patient states that he started having episodic chest pain for many months but it was usually happening once or twice a month but now for the last few days he started having multiple episodes of chest pain daily.  Chest pain is usually located in the center of chest, pressured-like sensation, nonradiating, occasionally gets better with rest and get worse with exertion.  Chest pain is also happening even at rest nowadays.  Patient states that his dyspnea is also worsening and and now it comes with few steps.  Patient also complaining of having nausea and diaphoresis during the chest pain.  Patient further reported that he has a history of obstructive sleep apnea and in process of being set up for CPAP.  Patient denies any recent injury to the chest or recent travel history.  It was reported by patient's wife that he was seen by cardiology recently and noted to have a right bundle branch block.  Patient is set up for outpatient stress test and echocardiogram but his chest pain is more frequent with worsening dyspnea that Friday came to the hospital for further evaluation today.  Patient admits of using cigars occasionally.  Patient otherwise denies fever, chills, headache, dizziness, vomiting, abdominal pain, diarrhea and edema of lower extremities.  ED Course: On arrival to the hospital patient had temperature of 98.4,  blood pressure 106/66, heart rate 80, respiratory rate 24 and oxygen saturation 98% on room air.  Blood work showed WBC 8.4, hemoglobin 13.5, BUN 17, creatinine 1.15, blood glucose 241.  BNP is 14.9 while 2 sets of troponin negative.  D-dimer is less than 0.27.  X-ray chest is negative for acute cardiopulmonary disease.  EKG showed normal sinus rhythm with premature ventricular complexes and right bundle branch block.  Patient is started on 2 L of oxygen with nasal cannula for comfort and chest pain is resolved at this time.  Review of Systems: As per HPI otherwise 10 point review of systems negative.  Unacceptable ROS statements: "10 systems reviewed," "Extensive" (without elaboration).  Acceptable ROS statements: "All others negative," "All others reviewed and are negative," and "All others unremarkable," with at LEAST ONE ROS documented Can't double dip - if using for HPI can't use for ROS  No past medical history on file.  Hypertension, obstructive sleep apnea, obesity   has no history on file for tobacco, alcohol, and drug.  Patient admits of using occasional cigar  No Known Allergies  NKDA  No family history on file. Patient has no family history of cardiac disease Unacceptable: Noncontributory, unremarkable, or negative. Acceptable: (example)Family history negative for heart disease  Prior to Admission medications   Not on File    Physical Exam: Vitals:   10/30/19 0230 10/30/19 0315 10/30/19 0345 10/30/19 0415  BP: 98/63 (!) 146/118 109/73 99/63  Pulse: 78 80 72 71  Resp: 17 18 (!) 21 (!) 21  Temp:  TempSrc:      SpO2: 98% 97% 97% 97%  Weight:      Height:        Constitutional: NAD, calm, comfortable Vitals:   10/30/19 0230 10/30/19 0315 10/30/19 0345 10/30/19 0415  BP: 98/63 (!) 146/118 109/73 99/63  Pulse: 78 80 72 71  Resp: 17 18 (!) 21 (!) 21  Temp:      TempSrc:      SpO2: 98% 97% 97% 97%  Weight:      Height:        General: Obese Caucasian male  laying comfortably in the bed and in no acute distress Eyes: PERRL, lids and conjunctivae normal ENMT: Mucous membranes are moist. Posterior pharynx clear of any exudate or lesions.Normal dentition.  Neck: normal, supple, no masses, no thyromegaly Respiratory: clear to auscultation bilaterally, no wheezing, no crackles. Normal respiratory effort. No accessory muscle use.  Cardiovascular: Chest nontender on palpation.  Regular rate and rhythm, no murmurs / rubs / gallops. No extremity edema. 2+ pedal pulses. No carotid bruits.  Abdomen: no tenderness, no masses palpated. No hepatosplenomegaly. Bowel sounds positive.  Musculoskeletal: no clubbing / cyanosis. No joint deformity upper and lower extremities. Good ROM, no contractures. Normal muscle tone.  Skin: no rashes, lesions, ulcers. No induration Neurologic: CN 2-12 grossly intact. Sensation intact, DTR normal. Strength 5/5 in all 4.  Psychiatric: Normal judgment and insight. Alert and oriented x 3. Normal mood.   (Anything < 9 systems with 2 bullets each down codes to level 1) (If patient refuses exam can't bill higher level) (Make sure to document decubitus ulcers present on admission -- if possible -- and whether patient has chronic indwelling catheter at time of admission)  Labs on Admission: I have personally reviewed following labs and imaging studies  CBC: Recent Labs  Lab 10/29/19 2200  WBC 8.4  HGB 13.5  HCT 39.2  MCV 90.7  PLT 239   Basic Metabolic Panel: Recent Labs  Lab 10/29/19 2200  NA 138  K 3.4*  CL 106  CO2 19*  GLUCOSE 241*  BUN 17  CREATININE 1.15  CALCIUM 9.2   GFR: Estimated Creatinine Clearance: 130.5 mL/min (by C-G formula based on SCr of 1.15 mg/dL). Liver Function Tests: No results for input(s): AST, ALT, ALKPHOS, BILITOT, PROT, ALBUMIN in the last 168 hours. No results for input(s): LIPASE, AMYLASE in the last 168 hours. No results for input(s): AMMONIA in the last 168 hours. Coagulation  Profile: No results for input(s): INR, PROTIME in the last 168 hours. Cardiac Enzymes: No results for input(s): CKTOTAL, CKMB, CKMBINDEX, TROPONINI in the last 168 hours. BNP (last 3 results) No results for input(s): PROBNP in the last 8760 hours. HbA1C: No results for input(s): HGBA1C in the last 72 hours. CBG: No results for input(s): GLUCAP in the last 168 hours. Lipid Profile: No results for input(s): CHOL, HDL, LDLCALC, TRIG, CHOLHDL, LDLDIRECT in the last 72 hours. Thyroid Function Tests: No results for input(s): TSH, T4TOTAL, FREET4, T3FREE, THYROIDAB in the last 72 hours. Anemia Panel: No results for input(s): VITAMINB12, FOLATE, FERRITIN, TIBC, IRON, RETICCTPCT in the last 72 hours. Urine analysis: No results found for: COLORURINE, APPEARANCEUR, LABSPEC, PHURINE, GLUCOSEU, HGBUR, BILIRUBINUR, KETONESUR, PROTEINUR, UROBILINOGEN, NITRITE, LEUKOCYTESUR  Radiological Exams on Admission: DG Chest 2 View  Result Date: 10/29/2019 CLINICAL DATA:  Chest pain EXAM: CHEST - 2 VIEW COMPARISON:  None. FINDINGS: The heart size and mediastinal contours are within normal limits. Both lungs are clear. The visualized skeletal structures  are unremarkable. IMPRESSION: No active cardiopulmonary disease. Electronically Signed   By: Randa Ngo M.D.   On: 10/29/2019 22:23      Assessment/Plan Principal Problem:   Chest pain Patient is most probably having unstable angina. Patient admitted for observation to cardiac telemetry. Cardiology consult ordered for evaluation and possible stress test Cardiogram ordered Troponin and EKG negative. D-dimer within normal limits. Nitro glycine sublingual as needed for chest pain. Oxygen supplementation with nasal cannula as needed. Lipid panel ordered. Hemoglobin A1c ordered. N.p.o.  Active Problems:   Exertional dyspnea Although work-up including chest x-ray and BNP negative. Echocardiogram ordered. Oxygen supplementation with nasal cannula as  needed.    Hyperglycemia Low-dose sliding scale insulin ordered. Blood glucose monitoring and hypoglycemic protocol in place. Hemoglobin A1c    DVT prophylaxis: Heparin Code Status: Full code Family Communication: Patient's wife is present at the bedside Disposition Plan: Patient will be discharged to home once cleared by cardiology Consults called: Cardiology Admission status: Observation/telemetry cardiac   Edmonia Lynch MD [facility] Pager [phone]   If 7PM-7AM, please contact night-coverage [email or web address] Password   10/30/2019, 4:18 AM

## 2019-10-30 NOTE — ED Notes (Signed)
Patient ambulated with 96-100% on RA c/o being lightheaded

## 2019-10-30 NOTE — Progress Notes (Signed)
  Echocardiogram 2D Echocardiogram has been performed.  Edward Obrien 10/30/2019, 10:38 AM

## 2019-10-31 ENCOUNTER — Observation Stay (HOSPITAL_COMMUNITY): Payer: Managed Care, Other (non HMO)

## 2019-10-31 DIAGNOSIS — R931 Abnormal findings on diagnostic imaging of heart and coronary circulation: Secondary | ICD-10-CM

## 2019-10-31 DIAGNOSIS — R079 Chest pain, unspecified: Secondary | ICD-10-CM | POA: Diagnosis not present

## 2019-10-31 DIAGNOSIS — R0600 Dyspnea, unspecified: Secondary | ICD-10-CM | POA: Diagnosis not present

## 2019-10-31 DIAGNOSIS — I2510 Atherosclerotic heart disease of native coronary artery without angina pectoris: Secondary | ICD-10-CM

## 2019-10-31 DIAGNOSIS — G4733 Obstructive sleep apnea (adult) (pediatric): Secondary | ICD-10-CM | POA: Diagnosis not present

## 2019-10-31 DIAGNOSIS — I2781 Cor pulmonale (chronic): Secondary | ICD-10-CM | POA: Diagnosis not present

## 2019-10-31 DIAGNOSIS — R739 Hyperglycemia, unspecified: Secondary | ICD-10-CM | POA: Diagnosis not present

## 2019-10-31 LAB — LIPID PANEL
Cholesterol: 167 mg/dL (ref 0–200)
HDL: 28 mg/dL — ABNORMAL LOW (ref >40)
LDL Cholesterol: 102 mg/dL — ABNORMAL HIGH (ref 0–99)
Total CHOL/HDL Ratio: 6 RATIO
Triglycerides: 184 mg/dL — ABNORMAL HIGH (ref <150)
VLDL: 37 mg/dL (ref 0–40)

## 2019-10-31 LAB — BASIC METABOLIC PANEL
Anion gap: 9 (ref 5–15)
BUN: 15 mg/dL (ref 6–20)
CO2: 26 mmol/L (ref 22–32)
Calcium: 9.1 mg/dL (ref 8.9–10.3)
Chloride: 104 mmol/L (ref 98–111)
Creatinine, Ser: 0.99 mg/dL (ref 0.61–1.24)
GFR calc Af Amer: >60 mL/min (ref >60)
GFR calc non Af Amer: >60 mL/min (ref >60)
Glucose, Bld: 105 mg/dL — ABNORMAL HIGH (ref 70–99)
Potassium: 4.1 mmol/L (ref 3.5–5.1)
Sodium: 139 mmol/L (ref 135–145)

## 2019-10-31 LAB — GLUCOSE, CAPILLARY
Glucose-Capillary: 108 mg/dL — ABNORMAL HIGH (ref 70–99)
Glucose-Capillary: 95 mg/dL (ref 70–99)

## 2019-10-31 MED ORDER — NITROGLYCERIN 0.4 MG SL SUBL: 0.4000 mg | SUBLINGUAL_TABLET | SUBLINGUAL | 0 refills | Status: AC | PRN

## 2019-10-31 MED ORDER — ASPIRIN EC 81 MG PO TBEC: 81.0000 mg | DELAYED_RELEASE_TABLET | Freq: Every day | ORAL | 2 refills | Status: AC

## 2019-10-31 MED ORDER — PANTOPRAZOLE SODIUM 40 MG PO TBEC: 40.0000 mg | DELAYED_RELEASE_TABLET | Freq: Every day | ORAL | 1 refills | Status: DC

## 2019-10-31 MED ORDER — NITROGLYCERIN 0.4 MG SL SUBL
SUBLINGUAL_TABLET | SUBLINGUAL | Status: AC
  Administered 2019-10-31: 12:00:00 0.8 mg via SUBLINGUAL
  Filled 2019-10-31: qty 2

## 2019-10-31 MED ORDER — METOPROLOL TARTRATE 25 MG PO TABS: 25.0000 mg | ORAL_TABLET | Freq: Once | ORAL | Status: DC

## 2019-10-31 MED ORDER — IOHEXOL 350 MG/ML SOLN
100.0000 mL | Freq: Once | INTRAVENOUS | Status: AC | PRN
  Administered 2019-10-31: 100 mL via INTRAVENOUS

## 2019-10-31 NOTE — Discharge Summary (Signed)
Physician Discharge Summary  Edward Obrien QQV:[id number] DOB: [birth year] DOA: 10/29/2019  PCP: Eartha Inch, MD  Admit date: 10/29/2019 Discharge date: 10/31/2019  Admitted From: Home  Disposition:  Home   Recommendations for Outpatient Follow-up and new medication changes:  1. Follow up with Dr. Cyndia Bent in 7 days 2. Patient placed on pantoprazole daily  3. Added as needed nitroglycerin for chest pain. 4. Added 81 mg aspirin 5. Patient would like to start life style modification before starting on statin therapy.  6. Consider to start patient on b blockade.   Home Health: no   Equipment/Devices: no    Discharge Condition: stable  CODE STATUS: full  Diet recommendation: heart healthy   Brief/Interim Summary:  Patient admitted with the working diagnosis of chest pain and dyspnea to rule out acute coronary disease.   49 year old male who presented with chest pain and dyspnea on exertion.  He does have significant past medical history for hypertension, obesity and obstructive sleep apnea. Recently diagnosed with SVT. Patient reported worsening of precordial chest pain, pressure-like in nature, worse with exertion, improved with rest.  Lately has have been at rest as well.  His chest pain is associated with dyspnea, diaphoresis and nausea.  Patient was seen as an outpatient and scheduled to have outpatient stress test ([facility]). On his initial physical examination his temperature 98.4, blood pressure 106/66, heart rate 80, respiratory 24, oxygen saturation 98%.  Heart S1-S2 present, rhythmic, lungs clear to auscultation, abdomen soft, no lower extremity edema.  Sodium 138, potassium 3.4, chloride 106, bicarb 19, glucose 241, creatinine 1.15, troponin I 8, white count 8.4, hemoglobin 13.5, hematocrit 39.2, platelets 239.  EKG 86 bpm, normal axis, right bundle branch block, manually QTc 487, sinus rhythm with PAC, no significant ST segment or T wave changes.   1.  Chest pain.  Patient was  admitted to the telemetry ward, his cardiac enzymes remain negative.  Patient was seen by cardiology, and underwent a coronary CT angiography.  It resulted in mild to moderate stenosis of the proximal and mid LAD.  With recommendations to continue symptom guided anti-ischemic pharmacotherapy as well as aggressive risk factor modification per guideline directed care.  Patient will continue blood pressure control, lifestyle modifications before starting a statin therapy.  Aspirin 81 mg daily has been added along with as needed nitroglycerin.  Patient will follow up with his cardiologist as an outpatient.  2.  Prediabetes mellitus.  Patient's hemoglobin A1c 6.0.  Patient was advised about lifestyle modifications.  3.  Hypertension.  Continue blood pressure control with amlodipine, lisinopril and hydrochlorothiazide.  4.  Obesity/obstructive sleep apnea.  Patient will follow-up as an outpatient, for CPAP arrangement, continue aggressive lifestyle modifications.  Discharge Diagnoses:  Principal Problem:   Chest pain Active Problems:   Exertional dyspnea   Hyperglycemia   OSA (obstructive sleep apnea)   Morbid obesity (HCC)   SVT (supraventricular tachycardia) (HCC)    Discharge Instructions  Discharge Instructions    Diet - low sodium heart healthy   Complete by: As directed    Discharge instructions   Complete by: As directed    Please follow with primary care in 7 days.   Increase activity slowly   Complete by: As directed      Allergies as of 10/31/2019   No Known Allergies     Medication List    TAKE these medications   amLODipine 5 MG tablet Commonly known as: NORVASC Take 5 mg by mouth daily.  aspirin-acetaminophen-caffeine 250-250-65 MG tablet Commonly known as: EXCEDRIN MIGRAINE Take 1 tablet by mouth every 6 (six) hours as needed for headache.   diclofenac 75 MG EC tablet Commonly known as: VOLTAREN Take 75 mg by mouth 2 (two) times daily as needed for pain.    ibuprofen 200 MG tablet Commonly known as: ADVIL Take 400 mg by mouth every 6 (six) hours as needed for moderate pain.   lisinopril-hydrochlorothiazide 20-25 MG tablet Commonly known as: ZESTORETIC Take 1 tablet by mouth daily.   multivitamin capsule Take 1 capsule by mouth daily.   nitroGLYCERIN 0.4 MG SL tablet Commonly known as: NITROSTAT Place 1 tablet (0.4 mg total) under the tongue every 5 (five) minutes as needed for chest pain.   pantoprazole 40 MG tablet Commonly known as: Protonix Take 1 tablet (40 mg total) by mouth daily.      Follow-up Information    Rhinehart, Olga Coaster, MD. Schedule an appointment as soon as possible for a visit.   Specialty: Cardiology Why: See in 3-4 weeks. Keep appt for sleep apnea, CPAP. Contact information: [address] [place] [address] [phone]          No Known Allergies  Consultations: Cardiology   Procedures/Studies: DG Chest 2 View  Result Date: 10/29/2019 CLINICAL DATA:  Chest pain EXAM: CHEST - 2 VIEW COMPARISON:  None. FINDINGS: The heart size and mediastinal contours are within normal limits. Both lungs are clear. The visualized skeletal structures are unremarkable. IMPRESSION: No active cardiopulmonary disease. Electronically Signed   By: Sharlet Salina M.D.   On: 10/29/2019 22:23   CT CORONARY MORPH W/CTA COR W/SCORE W/CA W/CM &/OR WO/CM  Addendum Date: 10/31/2019   ADDENDUM REPORT: 10/31/2019 13:30 CLINICAL DATA:  48 year old male with morbid obesity, hyperlipidemia and chest pain. EXAM: Cardiac/Coronary  CTA TECHNIQUE: The patient was scanned on a [employer]. FINDINGS: A 100 kV prospective scan was triggered in the descending thoracic aorta at 111 HU's. Axial non-contrast 3 mm slices were carried out through the heart. The data set was analyzed on a dedicated work station and scored using the Agatson method. Gantry rotation speed was 250 msecs and collimation was .6 mm. No beta  blockade and 0.8 mg of sl NTG was given. The 3D data set was reconstructed in 5% intervals of the 67-82 % of the R-R cycle. Diastolic phases were analyzed on a dedicated work station using MPR, MIP and VRT modes. The patient received 80 cc of contrast. Aorta: Normal size. Minimal diffuse atherosclerotic plaque, no calcifications. No dissection. Aortic Valve:  Trileaflet.  No calcifications. Coronary Arteries:  Normal coronary origin.  Right dominance. RCA is a large dominant artery that gives rise to PDA and PLA. There are minimal luminal irregularities. Left main is a large artery that gives rise to LAD and LCX arteries. Left main has no disease. LAD is a large vessel that gives rise to a small diagonal artery. Proximal and mid LAD has mild to moderate diffuse mixed plaque with stenosis 25-49% and possibly 50-69% in the mid portion. Distal LAD has no significant disease. LCX is a small non-dominant artery that has no obvious plaque. Other findings: Normal pulmonary vein drainage into the left atrium. Normal left atrial appendage without a thrombus. IMPRESSION: 1. Coronary calcium score of 56. This was 17 percentile for age and sex matched control. 2. Normal coronary origin with right dominance. 3. Study quality and interpretation affected by patient's size. CAD-RADS 3. Mild to moderate stenosis in the  proximal and mid LAD. Consider symptom-guided anti-ischemic pharmacotherapy as well as aggressive risk factor modification per guideline directed care. 4. Mildly dilated pulmonary artery suggestive of pulmonary hypertension. Electronically Signed   By: Tobias AlexanderKatarina  Nelson   On: 10/31/2019 13:30   Result Date: 10/31/2019 EXAM: OVER-READ INTERPRETATION  CT CHEST The following report is an over-read performed by radiologist Dr. Jeronimo [facility]avesKyle Talbot of [facility] on 10/31/2019. This over-read does not include interpretation of cardiac or coronary anatomy or pathology. The coronary CTA interpretation by the cardiologist  is attached. COMPARISON:  Chest radiograph 10/29/2019 FINDINGS: Vascular: Normal aortic caliber. No dissection. No central pulmonary embolism, on this non-dedicated study. Mediastinum/Nodes: No imaged thoracic adenopathy. The esophagus is mildly dilated with a fluid level within, including on 42/8. Lungs/Pleura: No pleural fluid. Clear imaged lungs. Upper Abdomen: Normal imaged portions of the liver, spleen, stomach. Musculoskeletal: No acute osseous abnormality. IMPRESSION: 1. No acute process in the chest. 2. Esophageal air fluid level suggests dysmotility or gastroesophageal reflux. Electronically Signed: By: Jeronimo GreavesKyle  Talbot M.D. On: 10/31/2019 12:18   ECHOCARDIOGRAM COMPLETE  Result Date: 10/30/2019    ECHOCARDIOGRAM REPORT   Patient Name:   Edward Obrien Date of Exam: 10/30/2019 Medical Rec #:  [id number]       Height:       76.0 in Accession #:    [id number]      Weight:       375.0 lb Date of Birth:  07/03/1971        BSA:          2.894 m Patient Age:    48 years        BP:           108/69 mmHg Patient Gender: M               HR:           62 bpm. Exam Location:  Inpatient Procedure: 2D Echo, Cardiac Doppler and Color Doppler Indications:    R07.9* Chest pain, unspecified  History:        Patient has no prior history of Echocardiogram examinations.  Sonographer:    Elmarie Shileyiffany Dance Referring Phys: [id number] [facility] Welton FlakesKHAN  Sonographer Comments: Technically difficult study due to poor echo windows. IMPRESSIONS  1. Left ventricular ejection fraction, by estimation, is 55 to 60%. The left ventricle has normal function. Left ventricular endocardial border not optimally defined to evaluate regional wall motion. Left ventricular diastolic parameters are consistent with Grade I diastolic dysfunction (impaired relaxation).  2. Right ventricular systolic function is normal. The right ventricular size is mildly enlarged. Tricuspid regurgitation signal is inadequate for assessing PA pressure.  3. The mitral valve is  normal in structure. Trivial mitral valve regurgitation. No evidence of mitral stenosis.  4. The aortic valve was not well visualized. Aortic valve regurgitation is not visualized. No aortic stenosis is present.  5. Aortic dilatation noted. There is mild dilatation of the aortic root measuring 40 mm.  6. The inferior vena cava is dilated in size with <50% respiratory variability, suggesting right atrial pressure of 15 mmHg. FINDINGS  Left Ventricle: LV wall thickness not well visualized for measurement, appears grossly normal in other views. Left ventricular ejection fraction, by estimation, is 55 to 60%. The left ventricle has normal function. Left ventricular endocardial border not optimally defined to evaluate regional wall motion. The left ventricular internal cavity size was normal in size. Left ventricular diastolic parameters are consistent with Grade I diastolic dysfunction (  impaired relaxation). Right Ventricle: The right ventricular size is mildly enlarged. No increase in right ventricular wall thickness. Right ventricular systolic function is normal. Tricuspid regurgitation signal is inadequate for assessing PA pressure. Left Atrium: Left atrial size was normal in size. Right Atrium: Right atrial size was normal in size. Pericardium: There is no evidence of pericardial effusion. Presence of pericardial fat pad. Mitral Valve: The mitral valve is normal in structure. Normal mobility of the mitral valve leaflets. Trivial mitral valve regurgitation. No evidence of mitral valve stenosis. Tricuspid Valve: The tricuspid valve is normal in structure. Tricuspid valve regurgitation is not demonstrated. No evidence of tricuspid stenosis. Aortic Valve: The aortic valve was not well visualized. Aortic valve regurgitation is not visualized. No aortic stenosis is present. Pulmonic Valve: The pulmonic valve was not well visualized. Pulmonic valve regurgitation is trivial. No evidence of pulmonic stenosis. Aorta: The  aortic root was not well visualized and aortic dilatation noted. There is mild dilatation of the aortic root measuring 40 mm. Venous: The inferior vena cava was not well visualized. The inferior vena cava is dilated in size with less than 50% respiratory variability, suggesting right atrial pressure of 15 mmHg. IAS/Shunts: The interatrial septum was not well visualized.  LEFT VENTRICLE PLAX 2D LVIDd:         5.13 cm  Diastology LVIDs:         4.58 cm  LV e' lateral:   11.10 cm/s LVOT diam:     2.40 cm  LV E/e' lateral: 5.5 LV SV:         79       LV e' medial:    6.64 cm/s LV SV Index:   27       LV E/e' medial:  9.3 LVOT Area:     4.52 cm  RIGHT VENTRICLE             IVC RV Basal diam:  3.86 cm     IVC diam: 2.54 cm RV Mid diam:    2.47 cm RV S prime:     12.10 cm/s TAPSE (M-mode): 2.1 cm LEFT ATRIUM             Index       RIGHT ATRIUM           Index LA diam:        4.10 cm 1.42 cm/m  RA Area:     25.00 cm LA Vol (A2C):   69.5 ml 24.02 ml/m RA Volume:   76.70 ml  26.51 ml/m LA Vol (A4C):   44.1 ml 15.24 ml/m LA Biplane Vol: 56.5 ml 19.53 ml/m  AORTIC VALVE LVOT Vmax:   85.00 cm/s LVOT Vmean:  54.500 cm/s LVOT VTI:    0.175 m  AORTA Ao Root diam: 4.00 cm Ao Asc diam:  3.40 cm MITRAL VALVE MV Area (PHT): 2.73 cm    SHUNTS MV Decel Time: 278 msec    Systemic VTI:  0.18 m MV E velocity: 61.60 cm/s  Systemic Diam: 2.40 cm MV A velocity: 67.40 cm/s MV E/A ratio:  0.91 Cherlynn Kaiser MD Electronically signed by Cherlynn Kaiser MD Signature Date/Time: 10/30/2019/4:25:55 PM    Final         Subjective: Patient is feeling better, no further chest pain, no nausea or vomiting, no dyspnea.   Discharge Exam: Vitals:   10/31/19 1023 10/31/19 1259  BP: 114/70 110/75  Pulse:  (!) 59  Resp:  17  Temp:  97.6 F (36.4  C)  SpO2:  100%   Vitals:   10/31/19 0011 10/31/19 0558 10/31/19 1023 10/31/19 1259  BP: 114/73 123/90 114/70 110/75  Pulse: 64 72  (!) 59  Resp: Temp: 97.9 F (36.6 C) 98.1  F (36.7 C)  97.6 F (36.4 C)  TempSrc: Oral Oral  Oral  SpO2: 100% 97%  100%  Weight:      Height:        General: Not in pain or dyspnea,  Neurology: Awake and alert, non focal  E ENT: no pallor, no icterus, oral mucosa moist Cardiovascular: No JVD. S1-S2 present, rhythmic, no gallops, rubs, or murmurs. No lower extremity edema. Pulmonary: positive breath sounds bilaterally, adequate air movement, no wheezing, rhonchi or rales. Gastrointestinal. Abdomen with no organomegaly, non tender, no rebound or guarding Skin. No rashes Musculoskeletal: no joint deformities   The results of significant diagnostics from this hospitalization (including imaging, microbiology, ancillary and laboratory) are listed below for reference.     Microbiology: Recent Results (from the past 240 hour(s))  SARS CORONAVIRUS 2 (TAT 6-24 HRS) Nasopharyngeal Nasopharyngeal Swab     Status: None   Collection Time: 10/30/19  2:42 AM   Specimen: Nasopharyngeal Swab  Result Value Ref Range Status   SARS Coronavirus 2 NEGATIVE NEGATIVE Final    Comment: (NOTE) SARS-CoV-2 target nucleic acids are NOT DETECTED. The SARS-CoV-2 RNA is generally detectable in upper and lower respiratory specimens during the acute phase of infection. Negative results do not preclude SARS-CoV-2 infection, do not rule out co-infections with other pathogens, and should not be used as the sole basis for treatment or other patient management decisions. Negative results must be combined with clinical observations, patient history, and epidemiological information. The expected result is Negative. Fact Sheet for Patients: HairSlick.no Fact Sheet for Healthcare Providers: quierodirigir.com This test is not yet approved or cleared by the [place] FDA and  has been authorized for detection and/or diagnosis of SARS-CoV-2 by FDA under an Emergency Use Authorization (EUA). This EUA  will remain  in effect (meaning this test can be used) for the duration of the COVID-19 declaration under Section 56 4(b)(1) of the Act, 21 U.S.C. section 360bbb-3(b)(1), unless the authorization is terminated or revoked sooner. Performed at [facility], [address], [place], [place] [address]      Labs: BNP (last 3 results) Recent Labs    10/30/19 0015  BNP 14.9   Basic Metabolic Panel: Recent Labs  Lab 10/29/19 2200 10/30/19 0408 10/31/19 0246  NA 138  --  139  K 3.4*  --  4.1  CL 106  --  104  CO2 19*  --  26  GLUCOSE 241*  --  105*  BUN 17  --  15  CREATININE 1.15 0.98 0.99  CALCIUM 9.2  --  9.1   Liver Function Tests: No results for input(s): AST, ALT, ALKPHOS, BILITOT, PROT, ALBUMIN in the last 168 hours. No results for input(s): LIPASE, AMYLASE in the last 168 hours. No results for input(s): AMMONIA in the last 168 hours. CBC: Recent Labs  Lab 10/29/19 2200 10/30/19 0408  WBC 8.4 7.4  HGB 13.5 12.6*  HCT 39.2 38.1*  MCV 90.7 92.9  PLT 239 192   Cardiac Enzymes: No results for input(s): CKTOTAL, CKMB, CKMBINDEX, TROPONINI in the last 168 hours. BNP: Invalid input(s): POCBNP CBG: Recent Labs  Lab 10/30/19 1351 10/30/19 1639 10/30/19 2129 10/31/19 0633 10/31/19 1047  GLUCAP 84 86 137*  108* 95   D-Dimer Recent Labs    10/30/19 0015  DDIMER <0.27   Hgb A1c Recent Labs    10/30/19 0611  HGBA1C 6.0*   Lipid Profile Recent Labs    10/30/19 0408 10/31/19 0246  CHOL 146 167  HDL 28* 28*  LDLCALC 102* 656*  TRIG 81 184*  CHOLHDL 5.2 6.0   Thyroid function studies No results for input(s): TSH, T4TOTAL, T3FREE, THYROIDAB in the last 72 hours.  Invalid input(s): FREET3 Anemia work up No results for input(s): VITAMINB12, FOLATE, FERRITIN, TIBC, IRON, RETICCTPCT in the last 72 hours. Urinalysis No results found for: COLORURINE, APPEARANCEUR, LABSPEC, PHURINE, GLUCOSEU, HGBUR, BILIRUBINUR, KETONESUR, PROTEINUR, UROBILINOGEN,  NITRITE, LEUKOCYTESUR Sepsis Labs Invalid input(s): PROCALCITONIN,  WBC,  LACTICIDVEN Microbiology Recent Results (from the past 240 hour(s))  SARS CORONAVIRUS 2 (TAT 6-24 HRS) Nasopharyngeal Nasopharyngeal Swab     Status: None   Collection Time: 10/30/19  2:42 AM   Specimen: Nasopharyngeal Swab  Result Value Ref Range Status   SARS Coronavirus 2 NEGATIVE NEGATIVE Final    Comment: (NOTE) SARS-CoV-2 target nucleic acids are NOT DETECTED. The SARS-CoV-2 RNA is generally detectable in upper and lower respiratory specimens during the acute phase of infection. Negative results do not preclude SARS-CoV-2 infection, do not rule out co-infections with other pathogens, and should not be used as the sole basis for treatment or other patient management decisions. Negative results must be combined with clinical observations, patient history, and epidemiological information. The expected result is Negative. Fact Sheet for Patients: HairSlick.no Fact Sheet for Healthcare Providers: quierodirigir.com This test is not yet approved or cleared by the [place] FDA and  has been authorized for detection and/or diagnosis of SARS-CoV-2 by FDA under an Emergency Use Authorization (EUA). This EUA will remain  in effect (meaning this test can be used) for the duration of the COVID-19 declaration under Section 56 4(b)(1) of the Act, 21 U.S.C. section 360bbb-3(b)(1), unless the authorization is terminated or revoked sooner. Performed at [facility], [address], [place], [place] [address]      Time coordinating discharge: 45 minutes  SIGNED:   Coralie Keens, MD  [facility] 10/31/2019, 2:09 PM

## 2019-10-31 NOTE — Progress Notes (Addendum)
Progress Note  Patient Name: Edward Obrien Date of Encounter: 10/31/2019  Primary Cardiologist:  Penni Bombard, MD  Subjective   No SOB, has not ambulated enough to get DOE Pt and wife were working on getting healthier, eating better, decreasing cholesterol in foods, vegetarian since 03/01  Inpatient Medications    Scheduled Meds: . amLODipine  5 mg Oral Daily  . enoxaparin (LOVENOX) injection  40 mg Subcutaneous Q24H  . lisinopril  20 mg Oral Daily   And  . hydrochlorothiazide  25 mg Oral Daily  . insulin aspart  0-5 Units Subcutaneous QHS  . insulin aspart  0-9 Units Subcutaneous TID WC  . metoprolol tartrate  25 mg Oral Once  . multivitamin with minerals  1 tablet Oral Daily   Continuous Infusions:  PRN Meds: acetaminophen, aspirin-acetaminophen-caffeine, ibuprofen, nitroGLYCERIN, ondansetron (ZOFRAN) IV   Vital Signs    Vitals:   10/30/19 2229 10/31/19 0011 10/31/19 0558 10/31/19 1023  BP: 110/75 114/73 123/90 114/70  Pulse: 80 64 72   Resp: 17 16 16    Temp: 98 F (36.7 C) 97.9 F (36.6 C) 98.1 F (36.7 C)   TempSrc: Oral Oral Oral   SpO2: 98% 100% 97%   Weight:      Height:        Intake/Output Summary (Last 24 hours) at 10/31/2019 1219 Last data filed at 10/31/2019 11/02/2019 Gross per 24 hour  Intake 240 ml  Output 2050 ml  Net -1810 ml   Filed Weights   10/29/19 2234  Weight: (!) 170.1 kg   Last Weight  Most recent update: 10/29/2019 10:35 PM   Weight  170.1 kg (375 lb)             Weight change:    Telemetry    SR, occ PVCs - Personally Reviewed  ECG    None today - Personally Reviewed  Physical Exam   General: Well developed, well nourished, male appearing in no acute distress. Head: Normocephalic, atraumatic.  Neck: Supple without bruits, JVD not seen elevated (much facial hair). Lungs:  Resp regular and unlabored, CTA. Heart: RRR, S1, S2, no S3, S4, or murmur; no rub. Abdomen: Soft, non-tender, non-distended with  normoactive bowel sounds. No hepatomegaly. No rebound/guarding. No obvious abdominal masses. Extremities: No clubbing, cyanosis, no edema. Distal pedal pulses are 2+ bilaterally. Neuro: Alert and oriented X 3. Moves all extremities spontaneously. Psych: Normal affect.  Labs    Hematology Recent Labs  Lab 10/29/19 2200 10/30/19 0408  WBC 8.4 7.4  RBC 4.32 4.10*  HGB 13.5 12.6*  HCT 39.2 38.1*  MCV 90.7 92.9  MCH 31.3 30.7  MCHC 34.4 33.1  RDW 13.0 13.0  PLT 239 192    Chemistry Recent Labs  Lab 10/29/19 2200 10/30/19 0408 10/31/19 0246  NA 138  --  139  K 3.4*  --  4.1  CL 106  --  104  CO2 19*  --  26  GLUCOSE 241*  --  105*  BUN 17  --  15  CREATININE 1.15 0.98 0.99  CALCIUM 9.2  --  9.1  GFRNONAA >60 >60 >60  GFRAA >60 >60 >60  ANIONGAP 13  --  9     High Sensitivity Troponin:   Recent Labs  Lab 10/29/19 2200 10/29/19 2348  TROPONINIHS 8 7      BNP Recent Labs  Lab 10/30/19 0015  BNP 14.9     DDimer  Recent Labs  Lab 10/30/19 0015  DDIMER <  0.27    Lab Results  Component Value Date   HGBA1C 6.0 (H) 10/30/2019     Radiology    DG Chest 2 View  Result Date: 10/29/2019 CLINICAL DATA:  Chest pain EXAM: CHEST - 2 VIEW COMPARISON:  None. FINDINGS: The heart size and mediastinal contours are within normal limits. Both lungs are clear. The visualized skeletal structures are unremarkable. IMPRESSION: No active cardiopulmonary disease. Electronically Signed   By: Sharlet Salina M.D.   On: 10/29/2019 22:23   ECHOCARDIOGRAM COMPLETE  Result Date: 10/30/2019    ECHOCARDIOGRAM REPORT   Patient Name:   [name] Date of Exam: 10/30/2019 Medical Rec #:  [id number]       Height:       76.0 in Accession #:    [id number]      Weight:       375.0 lb Date of Birth:  [birth year]        BSA:          2.894 m Patient Age:    49 years        BP:           108/69 mmHg Patient Gender: M               HR:           62 bpm. Exam Location:  Inpatient Procedure: 2D  Echo, Cardiac Doppler and Color Doppler Indications:    R07.9* Chest pain, unspecified  History:        Patient has no prior history of Echocardiogram examinations.  Sonographer:    Elmarie Shiley Dance Referring Phys: [id number] [facility] Z Welton Flakes  Sonographer Comments: Technically difficult study due to poor echo windows. IMPRESSIONS  1. Left ventricular ejection fraction, by estimation, is 55 to 60%. The left ventricle has normal function. Left ventricular endocardial border not optimally defined to evaluate regional wall motion. Left ventricular diastolic parameters are consistent with Grade I diastolic dysfunction (impaired relaxation).  2. Right ventricular systolic function is normal. The right ventricular size is mildly enlarged. Tricuspid regurgitation signal is inadequate for assessing PA pressure.  3. The mitral valve is normal in structure. Trivial mitral valve regurgitation. No evidence of mitral stenosis.  4. The aortic valve was not well visualized. Aortic valve regurgitation is not visualized. No aortic stenosis is present.  5. Aortic dilatation noted. There is mild dilatation of the aortic root measuring 40 mm.  6. The inferior vena cava is dilated in size with <50% respiratory variability, suggesting right atrial pressure of 15 mmHg. FINDINGS  Left Ventricle: LV wall thickness not well visualized for measurement, appears grossly normal in other views. Left ventricular ejection fraction, by estimation, is 55 to 60%. The left ventricle has normal function. Left ventricular endocardial border not optimally defined to evaluate regional wall motion. The left ventricular internal cavity size was normal in size. Left ventricular diastolic parameters are consistent with Grade I diastolic dysfunction (impaired relaxation). Right Ventricle: The right ventricular size is mildly enlarged. No increase in right ventricular wall thickness. Right ventricular systolic function is normal. Tricuspid regurgitation signal is  inadequate for assessing PA pressure. Left Atrium: Left atrial size was normal in size. Right Atrium: Right atrial size was normal in size. Pericardium: There is no evidence of pericardial effusion. Presence of pericardial fat pad. Mitral Valve: The mitral valve is normal in structure. Normal mobility of the mitral valve leaflets. Trivial mitral valve regurgitation. No evidence of mitral valve stenosis. Tricuspid Valve: The tricuspid valve  is normal in structure. Tricuspid valve regurgitation is not demonstrated. No evidence of tricuspid stenosis. Aortic Valve: The aortic valve was not well visualized. Aortic valve regurgitation is not visualized. No aortic stenosis is present. Pulmonic Valve: The pulmonic valve was not well visualized. Pulmonic valve regurgitation is trivial. No evidence of pulmonic stenosis. Aorta: The aortic root was not well visualized and aortic dilatation noted. There is mild dilatation of the aortic root measuring 40 mm. Venous: The inferior vena cava was not well visualized. The inferior vena cava is dilated in size with less than 50% respiratory variability, suggesting right atrial pressure of 15 mmHg. IAS/Shunts: The interatrial septum was not well visualized.  LEFT VENTRICLE PLAX 2D LVIDd:         5.13 cm  Diastology LVIDs:         4.58 cm  LV e' lateral:   11.10 cm/s LVOT diam:     2.40 cm  LV E/e' lateral: 5.5 LV SV:         79       LV e' medial:    6.64 cm/s LV SV Index:   27       LV E/e' medial:  9.3 LVOT Area:     4.52 cm  RIGHT VENTRICLE             IVC RV Basal diam:  3.86 cm     IVC diam: 2.54 cm RV Mid diam:    2.47 cm RV S prime:     12.10 cm/s TAPSE (M-mode): 2.1 cm LEFT ATRIUM             Index       RIGHT ATRIUM           Index LA diam:        4.10 cm 1.42 cm/m  RA Area:     25.00 cm LA Vol (A2C):   69.5 ml 24.02 ml/m RA Volume:   76.70 ml  26.51 ml/m LA Vol (A4C):   44.1 ml 15.24 ml/m LA Biplane Vol: 56.5 ml 19.53 ml/m  AORTIC VALVE LVOT Vmax:   85.00 cm/s LVOT  Vmean:  54.500 cm/s LVOT VTI:    0.175 m  AORTA Ao Root diam: 4.00 cm Ao Asc diam:  3.40 cm MITRAL VALVE MV Area (PHT): 2.73 cm    SHUNTS MV Decel Time: 278 msec    Systemic VTI:  0.18 m MV E velocity: 61.60 cm/s  Systemic Diam: 2.40 cm MV A velocity: 67.40 cm/s MV E/A ratio:  0.91 Cherlynn Kaiser MD Electronically signed by Cherlynn Kaiser MD Signature Date/Time: 10/30/2019/4:25:55 PM    Final      Cardiac Studies   ECHO:  10/30/2019 1. Left ventricular ejection fraction, by estimation, is 55 to 60%. The  left ventricle has normal function. Left ventricular endocardial border  not optimally defined to evaluate regional wall motion. Left ventricular  diastolic parameters are consistent  with Grade I diastolic dysfunction (impaired relaxation).  2. Right ventricular systolic function is normal. The right ventricular  size is mildly enlarged. Tricuspid regurgitation signal is inadequate for  assessing PA pressure.  3. The mitral valve is normal in structure. Trivial mitral valve  regurgitation. No evidence of mitral stenosis.  4. The aortic valve was not well visualized. Aortic valve regurgitation  is not visualized. No aortic stenosis is present.  5. Aortic dilatation noted. There is mild dilatation of the aortic root  measuring 40 mm.  6. The inferior vena cava is  dilated in size with <50% respiratory  variability, suggesting right atrial pressure of 15 mmHg.   Patient Profile     49 y.o. male w/ hx palpitations and CP (Holter>>SVT and stress echo by Dr Boneta Lucks), HTN, OSA, obesity, was admitted 04/26 with CP, DOE.   Assessment & Plan    1. Chest pain - ez neg MI - ECG not acute - s/p cardiac CT>>results pending  2. DOE - BNP normal, d-dimer negative - see echo, TR signal inadequate for PA pressure, RA pressure 15 - CXR w/ NAD - BMI is 45 - MD to review data and advise further plan - may benefit from CPX, will leave to Dr Boneta Lucks  3. OSA - is to get fitted for  mask next month - has been sleeping very poorly  Otherwise, per IM Principal Problem:   Chest pain Active Problems:   Exertional dyspnea   Hyperglycemia   OSA (obstructive sleep apnea)   Morbid obesity (HCC)   SVT (supraventricular tachycardia) (HCC)    Signed, Theodore Demark , PA-C 12:19 PM 10/31/2019 Pager: [phone]  I have seen and examined the patient along with Theodore Demark, PAC.  I have reviewed the chart, notes and new data.  I agree with PA/NP's note.  Key new complaints: no active dyspnea or chest pain, occasional cough Key examination changes: obese, normal CV exam Key new findings / data: CT angio images reviewed and discussed with Dr. Delton See and the patient. Calcium score 83rd percentile, but no meaningful obstructive lesions. Note evidence of esophageal dysmotility reflux.   PLAN: Aggressive weight loss and increased exercise to help low HDL, which seems to be the biggest problem. Offered statin to reach LDL<70, but he would like to first see results of lifestyle changes and that is perfectly reasonable. Suspect dyspnea is due to untreated sleep apnea/pulmonary HTN. His RV is dilated. He has severe daytime hypersomnolence, has documented apneic events on home sleep study and is pending scheduling for CPAP titration. Weight loss is critical and he appears committed to it. Cough may be due to GERD. He does not have heartburn, but may benefit from trial of PPI. More importantly, elevated head of bead, avoid lying down after meals, smaller meal sizes, avoid chocolate/peppermint/carbonated beverages, etc. Refer to GI if symptoms persist.  Will schedule f/u and repeat lipids in 3 months.   Thurmon Fair, MD, [facility] [phone] 10/31/2019, 1:44 PM

## 2019-10-31 NOTE — Progress Notes (Signed)
Discharge teaching complete. Meds, diet, activity, follow up appointments reviewed and all questions answered. Prescriptions sent to pharmacy.  

## 2019-11-04 DIAGNOSIS — G4733 Obstructive sleep apnea (adult) (pediatric): Secondary | ICD-10-CM

## 2019-11-04 DIAGNOSIS — Z9989 Dependence on other enabling machines and devices: Secondary | ICD-10-CM

## 2019-11-04 HISTORY — DX: Obstructive sleep apnea (adult) (pediatric): G47.33

## 2019-11-04 HISTORY — DX: Dependence on other enabling machines and devices: Z99.89

## 2019-11-11 ENCOUNTER — Observation Stay (HOSPITAL_COMMUNITY)
Admission: EM | Admit: 2019-11-11 | Discharge: 2019-11-13 | Disposition: A | Discharge status: Home / Self Care | Payer: Managed Care, Other (non HMO) | Attending: Internal Medicine | Admitting: Internal Medicine

## 2019-11-11 ENCOUNTER — Other Ambulatory Visit: Payer: Self-pay

## 2019-11-11 ENCOUNTER — Emergency Department (HOSPITAL_COMMUNITY): Payer: Managed Care, Other (non HMO)

## 2019-11-11 DIAGNOSIS — I2510 Atherosclerotic heart disease of native coronary artery without angina pectoris: Secondary | ICD-10-CM | POA: Insufficient documentation

## 2019-11-11 DIAGNOSIS — I5032 Chronic diastolic (congestive) heart failure: Secondary | ICD-10-CM | POA: Insufficient documentation

## 2019-11-11 DIAGNOSIS — R079 Chest pain, unspecified: Secondary | ICD-10-CM | POA: Insufficient documentation

## 2019-11-11 DIAGNOSIS — I959 Hypotension, unspecified: Principal | ICD-10-CM | POA: Insufficient documentation

## 2019-11-11 DIAGNOSIS — Z79899 Other long term (current) drug therapy: Secondary | ICD-10-CM | POA: Diagnosis not present

## 2019-11-11 DIAGNOSIS — G4733 Obstructive sleep apnea (adult) (pediatric): Secondary | ICD-10-CM | POA: Diagnosis not present

## 2019-11-11 DIAGNOSIS — R0602 Shortness of breath: Secondary | ICD-10-CM | POA: Insufficient documentation

## 2019-11-11 DIAGNOSIS — I110 Hypertensive heart disease with heart failure: Secondary | ICD-10-CM | POA: Insufficient documentation

## 2019-11-11 DIAGNOSIS — R7303 Prediabetes: Secondary | ICD-10-CM | POA: Diagnosis not present

## 2019-11-11 DIAGNOSIS — Z6841 Body Mass Index (BMI) 40.0 and over, adult: Secondary | ICD-10-CM | POA: Insufficient documentation

## 2019-11-11 DIAGNOSIS — I471 Supraventricular tachycardia: Secondary | ICD-10-CM | POA: Insufficient documentation

## 2019-11-11 DIAGNOSIS — Z20822 Contact with and (suspected) exposure to covid-19: Secondary | ICD-10-CM | POA: Diagnosis not present

## 2019-11-11 DIAGNOSIS — R0600 Dyspnea, unspecified: Secondary | ICD-10-CM

## 2019-11-11 DIAGNOSIS — R0609 Other forms of dyspnea: Secondary | ICD-10-CM | POA: Diagnosis present

## 2019-11-11 DIAGNOSIS — R55 Syncope and collapse: Secondary | ICD-10-CM

## 2019-11-11 DIAGNOSIS — I272 Pulmonary hypertension, unspecified: Secondary | ICD-10-CM | POA: Diagnosis not present

## 2019-11-11 HISTORY — DX: Hyperlipidemia, unspecified: E78.5

## 2019-11-11 LAB — D-DIMER, QUANTITATIVE: D-Dimer, Quant: 0.3 ug/mL-FEU (ref 0.00–0.50)

## 2019-11-11 LAB — CBC WITH DIFFERENTIAL/PLATELET
Abs Immature Granulocytes: 0.04 10*3/uL (ref 0.00–0.07)
Basophils Absolute: 0.1 10*3/uL (ref 0.0–0.1)
Basophils Relative: 1 %
Eosinophils Absolute: 0.1 10*3/uL (ref 0.0–0.5)
Eosinophils Relative: 2 %
HCT: 40.2 % (ref 39.0–52.0)
Hemoglobin: 13.8 g/dL (ref 13.0–17.0)
Immature Granulocytes: 1 %
Lymphocytes Relative: 26 %
Lymphs Abs: 1.9 10*3/uL (ref 0.7–4.0)
MCH: 30.5 pg (ref 26.0–34.0)
MCHC: 34.3 g/dL (ref 30.0–36.0)
MCV: 88.9 fL (ref 80.0–100.0)
Monocytes Absolute: 0.6 10*3/uL (ref 0.1–1.0)
Monocytes Relative: 9 %
Neutro Abs: 4.6 10*3/uL (ref 1.7–7.7)
Neutrophils Relative %: 61 %
Platelets: 268 10*3/uL (ref 150–400)
RBC: 4.52 MIL/uL (ref 4.22–5.81)
RDW: 12.3 % (ref 11.5–15.5)
WBC: 7.3 10*3/uL (ref 4.0–10.5)
nRBC: 0 % (ref 0.0–0.2)

## 2019-11-11 LAB — BASIC METABOLIC PANEL
Anion gap: 16 — ABNORMAL HIGH (ref 5–15)
BUN: 12 mg/dL (ref 6–20)
CO2: 20 mmol/L — ABNORMAL LOW (ref 22–32)
Calcium: 9 mg/dL (ref 8.9–10.3)
Chloride: 102 mmol/L (ref 98–111)
Creatinine, Ser: 1.17 mg/dL (ref 0.61–1.24)
GFR calc Af Amer: >60 mL/min (ref >60)
GFR calc non Af Amer: >60 mL/min (ref >60)
Glucose, Bld: 131 mg/dL — ABNORMAL HIGH (ref 70–99)
Potassium: 3.3 mmol/L — ABNORMAL LOW (ref 3.5–5.1)
Sodium: 138 mmol/L (ref 135–145)

## 2019-11-11 LAB — MAGNESIUM: Magnesium: 2 mg/dL (ref 1.7–2.4)

## 2019-11-11 LAB — TROPONIN I (HIGH SENSITIVITY)
Troponin I (High Sensitivity): 8 ng/L (ref <18)
Troponin I (High Sensitivity): 7 ng/L (ref <18)

## 2019-11-11 LAB — PROTIME-INR
INR: 1.1 (ref 0.8–1.2)
Prothrombin Time: 14.1 seconds (ref 11.4–15.2)

## 2019-11-11 MED ORDER — LACTATED RINGERS IV BOLUS
1000.0000 mL | Freq: Once | INTRAVENOUS | Status: AC
  Administered 2019-11-11: 1000 mL via INTRAVENOUS

## 2019-11-11 MED ORDER — POTASSIUM CHLORIDE 20 MEQ/15ML (10%) PO SOLN
40.0000 meq | Freq: Once | ORAL | Status: AC
  Administered 2019-11-11: 40 meq via ORAL
  Filled 2019-11-11: qty 30

## 2019-11-11 NOTE — ED Triage Notes (Signed)
Pt was brought in by [facility] stretcher. Reportedly was doing yard work when he experienced a syncopal episode. Denies fall. Upon waking pt reported CP and SOB. 911 operator advised 324 ASA and one home SL nitro. CP resolved after interventions. EMS noted clear breath sounds with increased WOB, which resolved after O2 2 lpm [address]. Pt A+Ox4

## 2019-11-11 NOTE — ED Provider Notes (Signed)
[facility] EMERGENCY DEPARTMENT Provider Note   CSN: [id number] Arrival date & time: 11/11/19  2025     History Chief Complaint  Patient presents with  . Loss of Consciousness  . Chest Pain    Edward Obrien is a 49 y.o. male.  HPI Patient presents for sudden onset of shortness of breath.  At the time, he was at home, washing off his driveway.  He was not exerting himself at the time.  Patient reports that over the course of less than a minute, he went from being normal to having severe dyspnea.  He denies any chest pain at the time.  He told his wife to call 911.  Patient was given 1 sublingual nitroglycerin and 324 ASA at home.  Following medications, he had episode of lightheadedness.  At this time, he was still experiencing shortness of breath.  EMS arrived and transported the patient.  He was placed on supplemental oxygen for comfort.  He was not found to be hypoxic.  Patient reports that during transit to [facility], he had resolution of his shortness of breath.  He denies any onset of chest pain throughout this episode.  He had a similar episode 2 weeks ago.  At that time, he was exerting himself, climbing stairs.  He was seen at this hospital and admitted for cardiac evaluation.  He denies any illicit drug use.  He did have 2 mixed drinks earlier today.  He reports there was approximately 3 ounces of liquor in each one.    Past Medical History:  Diagnosis Date  . HTN (hypertension)   . SVT (supraventricular tachycardia) [facility])     Patient Active Problem List   Diagnosis Date Noted  . Hypotension 11/12/2019  . Chest pain 10/30/2019  . Exertional dyspnea 10/30/2019  . Hyperglycemia 10/30/2019  . OSA (obstructive sleep apnea)   . Morbid obesity (HCC)   . SVT (supraventricular tachycardia) (HCC)          Family History  Problem Relation Age of Onset  . Alzheimer's disease Mother   . Hypertension Mother   . Diabetes Mother   . Failure to thrive Father      Social History   Tobacco Use  . Smoking status: Current Every Day Smoker    Types: Cigars  Substance Use Topics  . Alcohol use: Not on file  . Drug use: Not on file    Home Medications Prior to Admission medications   Medication Sig Start Date End Date Taking? Authorizing Provider  amLODipine (NORVASC) 5 MG tablet Take 5 mg by mouth daily. 10/18/19  Yes [provider]  aspirin EC 81 MG tablet Take 1 tablet (81 mg total) by mouth daily. 10/31/19 10/30/20 Yes Arrien, York Ram, MD  aspirin-acetaminophen-caffeine (EXCEDRIN MIGRAINE) [phone] MG tablet Take 1 tablet by mouth every 6 (six) hours as needed for headache.   Yes [provider]  diclofenac (VOLTAREN) 75 MG EC tablet Take 75 mg by mouth 2 (two) times daily as needed for pain. 03/15/19 03/14/20 Yes [provider]  Docusate Sodium (STOOL SOFTENER) 100 MG capsule Take 100 mg by mouth daily.   Yes [provider]  ibuprofen (ADVIL) 200 MG tablet Take 400 mg by mouth every 6 (six) hours as needed for moderate pain.   Yes [provider]  lisinopril-hydrochlorothiazide (ZESTORETIC) 20-25 MG tablet Take 1 tablet by mouth daily. 10/18/19  Yes [provider]  Multiple Vitamin (MULTIVITAMIN) capsule Take 1 capsule by mouth daily.  Yes [provider]  nitroGLYCERIN (NITROSTAT) 0.4 MG SL tablet Place 1 tablet (0.4 mg total) under the tongue every 5 (five) minutes as needed for chest pain. 10/31/19  Yes Arrien, York Ram, MD  Omega 3 1200 MG CAPS Take 2 capsules by mouth daily.   Yes [provider]  pantoprazole (PROTONIX) 40 MG tablet Take 1 tablet (40 mg total) by mouth daily. 10/31/19  Yes Barrett, Joline Salt, PA-C  Probiotic Product (PROBIOTIC PO) Take 1 tablet by mouth daily.   Yes [provider]    Allergies    Patient has no known allergies.  Review of Systems   Review of Systems  Constitutional: Negative for activity change, appetite  change, chills, diaphoresis, fatigue and fever.  HENT: Negative for ear pain and sore throat.   Eyes: Negative for pain and visual disturbance.  Respiratory: Positive for shortness of breath. Negative for cough, wheezing and stridor.   Cardiovascular: Negative for chest pain, palpitations and leg swelling.  Gastrointestinal: Negative for abdominal pain, diarrhea, nausea and vomiting.  Genitourinary: Negative for dysuria and hematuria.  Musculoskeletal: Negative for arthralgias, back pain, myalgias, neck pain and neck stiffness.  Skin: Negative for color change and rash.  Neurological: Positive for dizziness and light-headedness. Negative for seizures, speech difficulty, weakness, numbness and headaches. Syncope: Near syncope.  Hematological: Does not bruise/bleed easily.  All other systems reviewed and are negative.   Physical Exam Updated Vital Signs BP 124/68 (BP Location: Left Arm)   Pulse 77   Temp 98.2 F (36.8 C) (Oral)   Resp 20   Ht 6\' 4"  (1.93 m)   Wt (!) 170 kg   SpO2 98%   BMI 45.62 kg/m   Physical Exam Vitals and nursing note reviewed.  Constitutional:      General: He is not in acute distress.    Appearance: He is well-developed. He is obese. He is not ill-appearing, toxic-appearing or diaphoretic.  HENT:     Head: Normocephalic and atraumatic.  Eyes:     Conjunctiva/sclera: Conjunctivae normal.  Cardiovascular:     Rate and Rhythm: Normal rate and regular rhythm.     Pulses:          Radial pulses are 2+ on the right side and 2+ on the left side.     Heart sounds: No murmur.  Pulmonary:     Effort: Pulmonary effort is normal. No tachypnea or respiratory distress.     Breath sounds: Normal breath sounds. No decreased breath sounds, wheezing, rhonchi or rales.  Chest:     Chest wall: No tenderness.  Abdominal:     Palpations: Abdomen is soft.     Tenderness: There is no abdominal tenderness. There is no guarding.  Musculoskeletal:        General: Normal  range of motion.     Cervical back: Normal range of motion and neck supple.     Right lower leg: No edema.     Left lower leg: No edema.  Skin:    General: Skin is warm and dry.     Capillary Refill: Capillary refill takes less than 2 seconds.  Neurological:     General: No focal deficit present.     Mental Status: He is alert and oriented to person, place, and time.     Cranial Nerves: No cranial nerve deficit.     Motor: No weakness.  Psychiatric:        Mood and Affect: Mood normal.  Behavior: Behavior normal.     ED Results / Procedures / Treatments   Labs (all labs ordered are listed, but only abnormal results are displayed) Labs Reviewed  BASIC METABOLIC PANEL - Abnormal; Notable for the following components:      Result Value   Potassium 3.3 (*)    CO2 20 (*)    Glucose, Bld 131 (*)    Anion gap 16 (*)    All other components within normal limits  BASIC METABOLIC PANEL - Abnormal; Notable for the following components:   Glucose, Bld 105 (*)    All other components within normal limits  CBC - Abnormal; Notable for the following components:   HCT 38.7 (*)    All other components within normal limits  RESPIRATORY PANEL BY RT PCR (FLU A&B, COVID)  CBC WITH DIFFERENTIAL/PLATELET  MAGNESIUM  D-DIMER, QUANTITATIVE (NOT AT [facility])  PROTIME-INR  MAGNESIUM  TROPONIN I (HIGH SENSITIVITY)  TROPONIN I (HIGH SENSITIVITY)    EKG EKG Interpretation  Date/Time:  Saturday Nov 11 2019 20:35:15 EDT Ventricular Rate:  75 PR Interval:    QRS Duration: 173 QT Interval:  431 QTC Calculation: 482 R Axis:   98 Text Interpretation: Sinus rhythm RBBB and LPFB since last tracing no significant change Confirmed by Eber Hong ([id number]) on 11/11/2019 8:37:00 PM   Radiology DG Chest Portable 1 View  Result Date: 11/11/2019 CLINICAL DATA:  49 year old male with shortness of breath EXAM: PORTABLE CHEST 1 VIEW COMPARISON:  Chest radiograph dated 10/29/2019. FINDINGS: The lungs are  clear. There is no pleural effusion or pneumothorax. Mild cardiomegaly. No acute osseous pathology. IMPRESSION: 1. No acute cardiopulmonary process. 2. Mild cardiomegaly. Electronically Signed   By: Elgie Collard M.D.   On: 11/11/2019 21:35    Procedures Procedures (including critical care time)  Medications Ordered in ED Medications  aspirin EC tablet 81 mg (has no administration in time range)  enoxaparin (LOVENOX) injection 80 mg (has no administration in time range)  lactated ringers bolus 1,000 mL (0 mLs Intravenous Stopped 11/11/19 2222)  potassium chloride 20 MEQ/15ML (10%) solution 40 mEq (40 mEq Oral Given 11/11/19 2347)    ED Course  I have reviewed the triage vital signs and the nursing notes.  Pertinent labs & imaging results that were available during my care of the patient were reviewed by me and considered in my medical decision making (see chart for details).    MDM Rules/Calculators/A&P                      Patient is a 49 year old male who presents for sudden onset shortness of breath.  At time of onset, patient was washing off his driveway.  This was not particularly exertional effort.  Following onset dyspnea, patient took one SL NTG and 324 ASA.  EMS was called.  Episode of dyspnea continued.  During this time, patient did feel lightheaded.  He did not lose consciousness.  He did not experience any chest pains or palpitations during this episode.  EMS arrived and transported the patient to [facility].  During transit, he was given supplemental oxygen for comfort.  He subsequently had improvement in his breathing.  Upon arrival in the ED, his vital signs are normal.  He is saturating well on room air.  He has no visible increased work of breathing.  Lungs are clear to auscultation.   EKG shows x-ray shows normal sinus rhythm with RBBB, consistent with prior EKGs.  There are no  ST segment changes.  Chest x-ray shows no focal opacities in lung fields, no pleural  effusions, pulmonary edema.  There is mild cardiomegaly.  Labs are notable for mild hypokalemia (3.3) replaced in the ED, and anion gap of 16.  D-dimer and troponin were normal.  CBC shows normal hemoglobin and no leukocytosis.  Upon reassessment, patient continues to rest comfortably in the ED.  Upon chart review, patient had extensive cardiac work-up, including echocardiogram and coronary CT, 10 days ago.  Following normal EKG, normal chest x-ray, normal troponin and D-dimer, plan was for discharge home with cardiology follow-up as scheduled.  However, while in the ED, patient had persistent hypotension.  Bolus of IVF was given without increase in blood pressure.  Patient was ambulated in the ED and experienced exertional shortness of breath.  Blood pressures remained low.  Given the persistent hypotension, patient was admitted to hospitalist service for further observation.   Final Clinical Impression(s) / ED Diagnoses Final diagnoses:  Near syncope  Dyspnea, unspecified type    Rx / DC Orders ED Discharge Orders    None       Godfrey Pick, MD 11/12/19 5498    Noemi Chapel, MD 11/13/19 1445

## 2019-11-11 NOTE — ED Provider Notes (Signed)
I saw and evaluated the patient, reviewed the resident's note and I agree with the findings and plan.  Pertinent History: Pt was doing some work around the house - had sudden onset of SOB without any significant exertion.  He had no syncopal episode but had some light headedness with CP when he came around - EMS called - ASA given and SL nitro - CP resolved.  The patient had an extensive work-up when he was in the hospital that showed that he had a slightly dilated right ventricle as well as a history of some mild to moderate obstructive disease in his heart.  He was also told he had sleep apnea, followed up for sleep study and was found to have sleep apnea treated with CPAP, he has now had excellent sleep ever since starting that last week.  Thanks  Pertinent Exam findings: clear lungs and heart sounds - neuro normal - no distress, EKG without acute changes.  Had Echo and CT coronary when in hospital 2 weeks ago - showed mild to moderate LAD disease and mild RV dilation - recommendation per cardiolgy was for risk factor modification and consideration of B blockade.  I was personally present and directly supervised the following procedures:  Medical evaluation of syncope   EKG Interpretation  Date/Time:  Saturday Nov 11 2019 20:35:15 EDT Ventricular Rate:  75 PR Interval:    QRS Duration: 173 QT Interval:  431 QTC Calculation: 482 R Axis:   98 Text Interpretation: Sinus rhythm RBBB and LPFB since last tracing no significant change Confirmed by Eber Hong ([id number]) on 11/11/2019 8:37:00 PM Also confirmed by Eber Hong ([id number]), editor Ronnie Derby [phone])  on 11/12/2019 8:11:17 AM       I personally interpreted the EKG as well as the resident and agree with the interpretation on the resident's chart.  Final diagnoses:  Near syncope  Dyspnea, unspecified type      Eber Hong, MD 11/13/19 1445

## 2019-11-12 ENCOUNTER — Encounter (HOSPITAL_COMMUNITY): Payer: Self-pay | Admitting: Internal Medicine

## 2019-11-12 DIAGNOSIS — I959 Hypotension, unspecified: Secondary | ICD-10-CM | POA: Diagnosis not present

## 2019-11-12 DIAGNOSIS — I952 Hypotension due to drugs: Secondary | ICD-10-CM

## 2019-11-12 DIAGNOSIS — R0600 Dyspnea, unspecified: Secondary | ICD-10-CM | POA: Diagnosis not present

## 2019-11-12 DIAGNOSIS — G4733 Obstructive sleep apnea (adult) (pediatric): Secondary | ICD-10-CM

## 2019-11-12 DIAGNOSIS — R0789 Other chest pain: Secondary | ICD-10-CM

## 2019-11-12 LAB — BASIC METABOLIC PANEL
Anion gap: 7 (ref 5–15)
BUN: 11 mg/dL (ref 6–20)
CO2: 25 mmol/L (ref 22–32)
Calcium: 9 mg/dL (ref 8.9–10.3)
Chloride: 108 mmol/L (ref 98–111)
Creatinine, Ser: 1.16 mg/dL (ref 0.61–1.24)
GFR calc Af Amer: >60 mL/min (ref >60)
GFR calc non Af Amer: >60 mL/min (ref >60)
Glucose, Bld: 105 mg/dL — ABNORMAL HIGH (ref 70–99)
Potassium: 4 mmol/L (ref 3.5–5.1)
Sodium: 140 mmol/L (ref 135–145)

## 2019-11-12 LAB — CBC
HCT: 38.7 % — ABNORMAL LOW (ref 39.0–52.0)
Hemoglobin: 13.1 g/dL (ref 13.0–17.0)
MCH: 30.4 pg (ref 26.0–34.0)
MCHC: 33.9 g/dL (ref 30.0–36.0)
MCV: 89.8 fL (ref 80.0–100.0)
Platelets: 249 10*3/uL (ref 150–400)
RBC: 4.31 MIL/uL (ref 4.22–5.81)
RDW: 12.4 % (ref 11.5–15.5)
WBC: 6.4 10*3/uL (ref 4.0–10.5)
nRBC: 0 % (ref 0.0–0.2)

## 2019-11-12 LAB — RESPIRATORY PANEL BY RT PCR (FLU A&B, COVID)
Influenza A by PCR: NEGATIVE
Influenza B by PCR: NEGATIVE
SARS Coronavirus 2 by RT PCR: NEGATIVE

## 2019-11-12 LAB — MAGNESIUM: Magnesium: 2 mg/dL (ref 1.7–2.4)

## 2019-11-12 MED ORDER — PANTOPRAZOLE SODIUM 40 MG PO TBEC
40.0000 mg | DELAYED_RELEASE_TABLET | Freq: Every day | ORAL | Status: DC
  Administered 2019-11-12 – 2019-11-13 (×2): 40 mg via ORAL
  Filled 2019-11-12 (×2): qty 1

## 2019-11-12 MED ORDER — DICLOFENAC SODIUM 75 MG PO TBEC
75.0000 mg | DELAYED_RELEASE_TABLET | Freq: Two times a day (BID) | ORAL | Status: DC | PRN
  Filled 2019-11-12: qty 1

## 2019-11-12 MED ORDER — ASPIRIN EC 81 MG PO TBEC
81.0000 mg | DELAYED_RELEASE_TABLET | Freq: Every day | ORAL | Status: DC
  Filled 2019-11-12: qty 1

## 2019-11-12 MED ORDER — ENOXAPARIN SODIUM 80 MG/0.8ML ~~LOC~~ SOLN
80.0000 mg | Freq: Every day | SUBCUTANEOUS | Status: DC
  Administered 2019-11-12: 80 mg via SUBCUTANEOUS
  Filled 2019-11-12 (×2): qty 0.8

## 2019-11-12 MED ORDER — METOPROLOL TARTRATE 12.5 MG HALF TABLET
12.5000 mg | ORAL_TABLET | Freq: Two times a day (BID) | ORAL | Status: DC
  Administered 2019-11-12 – 2019-11-13 (×2): 12.5 mg via ORAL
  Filled 2019-11-12 (×4): qty 1

## 2019-11-12 MED ORDER — ASPIRIN EC 81 MG PO TBEC
81.0000 mg | DELAYED_RELEASE_TABLET | Freq: Every day | ORAL | Status: DC
  Administered 2019-11-12 – 2019-11-13 (×2): 81 mg via ORAL
  Filled 2019-11-12: qty 1

## 2019-11-12 NOTE — Plan of Care (Signed)
  Problem: Clinical Measurements: Goal: Ability to maintain clinical measurements within normal limits will improve Outcome: Progressing   Problem: Pain Managment: Goal: General experience of comfort will improve Outcome: Progressing   

## 2019-11-12 NOTE — H&P (Signed)
History and Physical    Edward Obrien [id number]:[id number] DOB: April 17, 1971 DOA: 11/11/2019  PCP: Eartha Inch, MD Patient coming from: home  Chief Complaint: SOB, sweating, hypotension, "chest pressure"  I have personally briefly reviewed patient's old medical records in [facility]  HPI:  Edward Obrien is a very pleasant 49 yo CM with PMH obesity, HTN, HLD, OSA (now on CPAP), SVTs who was recently admitted 4/26-4/27 for chest pain. 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 Echo also noted EF 55-60%, Gr 1 dd and enlarged right heart. He underwent a sleep study outpatient after discharge and has been on CPAP now at home for his OSA and says he's slept better the last 3-4 nights than he ever has his entire life.   Today, he was at home mowing the lawn on his riding lawnmower as he typically does.  He very acutely became short of breath.  His wife was next to him and states that he grabbed his chest when he had this sensation.  It lasted approximately 30 minutes until EMS had placed oxygen on him which helped his symptoms.  Of note, he had also taken one of his nitroglycerin tablets as well as 4 baby aspirin per advice from 911 which did not provide any significant relief.  His wife also notes that he was diaphoretic despite his activity being very nonexertional as they were taking their time with activities this morning.  He denied any nausea/vomiting.  When asked to describe the sensation on his chest he states that it just felt like "pressure".  It was similar to the shortness of breath he had previously when presenting on 10/30/2019 but states that it was "far worse". Also since he has been wearing his CPAP, he has noted that his blood pressure has been slowly downtrending.  Typically he has pressures in the 130s over 90s and this morning noted that it had overall down trended  since starting on CPAP therapy.  He keeps a blood pressure log on his phone which he was able to easily refer to tonight. He was hypotensive in the ER and despite a walk test, he remained short of breath and hypotensive.  Therefore, he was admitted for further work-up and monitoring overnight. He has been compliant with his home medication regimen with no confusion.  There were no adjustments to his blood pressure regimen at recent discharge either.  He remains on lisinopril-HCTZ and amlodipine at his previous doses.   Assessment/Plan:  Hypotension - differential at this time includes possible decreased right sided heart pressures from recent CPAP therapy (3-4 nights now at home) and he may no longer need triple-therapy BP regimen. Other considered differential includes worsening coronary perfusion from underlying CAD (and may warrant LHC still?). Would be prudent to have cardiology weight in on this once more while patient re-admitted (especially given risk fators; consult placed) - hold home BP meds; if/when resuming may consider holding amlodipine all-together - s/p IVF bolus in ER -Continue diet, hold off on further IV fluids  Dyspnea -Patient endorses tremendous improvement in his sleeping pattern since starting on CPAP for his underlying OSA.  Dyspnea had improved from this standpoint but acutely worsened today with no exertion.  Still may be his underlying OSA/pulmonary hypertension contributing, however still some concern for CAD; follow up cardiology consult  OSA - CPAP ordered qhs  HTN - see hypotension  Prediabetes - patient has adapted some lifestyle changes at home; continue encouragement  Code Status:  Code Status History    Date Active Date Inactive Code Status Order ID Comments User Context   10/30/2019 0323 10/31/2019 2001 Full Code [id number]  Thalia Party, DO ED   Advance Care Planning Activity      DVT Prophylaxis:enoxaparin (Lovenox) 40mg  SQ 2 hours prior to  surgery then every day Anticipated disposition is to home  History: Past Medical History:  Diagnosis Date  . HTN (hypertension)   . SVT (supraventricular tachycardia) (HCC)     reports that he has been smoking cigars. He does not have any smokeless tobacco history on file. No history on file for alcohol and drug.  No Known Allergies  Family History  Problem Relation Age of Onset  . Alzheimer's disease Mother   . Hypertension Mother   . Diabetes Mother   . Failure to thrive Father     Home Medications: Prior to Admission medications   Medication Sig Start Date End Date Taking? Authorizing Provider  amLODipine (NORVASC) 5 MG tablet Take 5 mg by mouth daily. 10/18/19   [provider]  aspirin EC 81 MG tablet Take 1 tablet (81 mg total) by mouth daily. 10/31/19 10/30/20  Arrien, 11/01/20, MD  aspirin-acetaminophen-caffeine (EXCEDRIN MIGRAINE) [id number] MG tablet Take 1 tablet by mouth every 6 (six) hours as needed for headache.    [provider]  diclofenac (VOLTAREN) 75 MG EC tablet Take 75 mg by mouth 2 (two) times daily as needed for pain. 03/15/19 03/14/20  [provider]  ibuprofen (ADVIL) 200 MG tablet Take 400 mg by mouth every 6 (six) hours as needed for moderate pain.    [provider]  lisinopril-hydrochlorothiazide (ZESTORETIC) 20-25 MG tablet Take 1 tablet by mouth daily. 10/18/19   [provider]  Multiple Vitamin (MULTIVITAMIN) capsule Take 1 capsule by mouth daily.    [provider]  nitroGLYCERIN (NITROSTAT) 0.4 MG SL tablet Place 1 tablet (0.4 mg total) under the tongue every 5 (five) minutes as needed for chest pain. 10/31/19   Arrien, 11/02/19, MD  pantoprazole (PROTONIX) 40 MG tablet Take 1 tablet (40 mg total) by mouth daily. 10/31/19   Barrett, 11/02/19, PA-C    Review of Systems:  Review of Systems - General ROS: negative for - chills or fever Respiratory ROS: positive for - shortness of  breath Cardiovascular ROS: positive for - chest pressure Gastrointestinal ROS: negative for - abdominal pain  Physical Exam: Vitals:   11/11/19 2300 11/11/19 2315 11/11/19 2330 11/11/19 2345  BP: (!) 87/59 (!) 88/59 103/65 96/62  Pulse: 78 77 72 75  Resp: 13 12 20 17   SpO2: 97% 98% 95% 94%  Weight:      Height:       General appearance: Pleasant adult man resting in bed in no distress with wife bedside Head: Normocephalic, without obvious abnormality Eyes: EOMI Lungs: clear to auscultation bilaterally Heart: regular rate and rhythm and S1, S2 normal Abdomen: normal findings: bowel sounds normal and soft, non-tender Extremities: No lower extremity edema Skin: mobility and turgor normal Neurologic: Grossly normal  Labs on Admission:  I have personally reviewed following labs and imaging studies Results for orders placed or performed during the hospital encounter of 11/11/19 (from the past 24 hour(s))  CBC with Differential     Status: None   Collection Time: 11/11/19  8:57 PM  Result Value Ref Range   WBC 7.3  4.0 - 10.5 K/uL   RBC 4.52 4.22 - 5.81 MIL/uL   Hemoglobin 13.8 13.0 - 17.0 g/dL   HCT 40.2 39.0 - 52.0 %   MCV 88.9 80.0 - 100.0 fL   MCH 30.5 26.0 - 34.0 pg   MCHC 34.3 30.0 - 36.0 g/dL   RDW 12.3 11.5 - 15.5 %   Platelets 268 150 - 400 K/uL   nRBC 0.0 0.0 - 0.2 %   Neutrophils Relative % 61 %   Neutro Abs 4.6 1.7 - 7.7 K/uL   Lymphocytes Relative 26 %   Lymphs Abs 1.9 0.7 - 4.0 K/uL   Monocytes Relative 9 %   Monocytes Absolute 0.6 0.1 - 1.0 K/uL   Eosinophils Relative 2 %   Eosinophils Absolute 0.1 0.0 - 0.5 K/uL   Basophils Relative 1 %   Basophils Absolute 0.1 0.0 - 0.1 K/uL   Immature Granulocytes 1 %   Abs Immature Granulocytes 0.04 0.00 - 0.07 K/uL  Basic metabolic panel     Status: Abnormal   Collection Time: 11/11/19  8:57 PM  Result Value Ref Range   Sodium 138 135 - 145 mmol/L   Potassium 3.3 (L) 3.5 - 5.1 mmol/L   Chloride 102 98 - 111 mmol/L    CO2 20 (L) 22 - 32 mmol/L   Glucose, Bld 131 (H) 70 - 99 mg/dL   BUN 12 6 - 20 mg/dL   Creatinine, Ser 1.17 0.61 - 1.24 mg/dL   Calcium 9.0 8.9 - 10.3 mg/dL   GFR calc non Af Amer >60 >60 mL/min   GFR calc Af Amer >60 >60 mL/min   Anion gap 16 (H) 5 - 15  Magnesium     Status: None   Collection Time: 11/11/19  8:57 PM  Result Value Ref Range   Magnesium 2.0 1.7 - 2.4 mg/dL  Troponin I (High Sensitivity)     Status: None   Collection Time: 11/11/19  8:57 PM  Result Value Ref Range   Troponin I (High Sensitivity) 8 <18 ng/L  D-dimer, quantitative (not at [facility])     Status: None   Collection Time: 11/11/19  8:57 PM  Result Value Ref Range   D-Dimer, Quant 0.30 0.00 - 0.50 ug/mL-FEU  Protime-INR     Status: None   Collection Time: 11/11/19  8:57 PM  Result Value Ref Range   Prothrombin Time 14.1 11.4 - 15.2 seconds   INR 1.1 0.8 - 1.2  Troponin I (High Sensitivity)     Status: None   Collection Time: 11/11/19 10:53 PM  Result Value Ref Range   Troponin I (High Sensitivity) 7 <18 ng/L     Radiological Exams on Admission: DG Chest Portable 1 View  Result Date: 11/11/2019 CLINICAL DATA:  49 year old male with shortness of breath EXAM: PORTABLE CHEST 1 VIEW COMPARISON:  Chest radiograph dated 10/29/2019. FINDINGS: The lungs are clear. There is no pleural effusion or pneumothorax. Mild cardiomegaly. No acute osseous pathology. IMPRESSION: 1. No acute cardiopulmonary process. 2. Mild cardiomegaly. Electronically Signed   By: Anner Crete M.D.   On: 11/11/2019 21:35   DG Chest Portable 1 View  Final Result      Consults called:  Cardiology consult placed  EKG: Independently reviewed. Sinus with no ST dep/elev    Dwyane Dee, MD [facility] Pager: Secure chat via [place] pager # : [phone]  If 7PM-7AM, please contact night-coverage [email or web address] Use universal [place] password for that web site. If you do not  have the password, please call the  hospital operator.  11/12/2019, 12:22 AM

## 2019-11-12 NOTE — Consult Note (Addendum)
Cardiology Consultation:   Patient ID: Edward Obrien; [id number]; 1970-10-20   Admit date: 11/11/2019 Date of Consult: 11/12/2019  Primary Care Provider: Chesley Noon, MD Primary Cardiologist: Nolene Ebbs, MD 10/17/2019 Primary Electrophysiologist:  None   Patient Profile:   Edward Obrien is a 49 y.o. male with a hx of non-obs CAD by CT 10/31/2019, HTN, HLD, OSA on CPAP, obesity, SVT, who is being seen today for the evaluation of chest pain at the request of Dr Avon Gully.  History of Present Illness:   Edward Obrien was admitted 04/26-04/27/2021 for CP. Cardiac CT w/ non-obs dz>>med rx, EF nl on echo w/ grade 1 dd. Sleep study after d/c>>now on CPAP.  Pt admitted 05/09 for SOB/CP>>cards asked to see.   Edward Obrien has been doing steadily better since discharge.  He got on the CPAP and has been sleeping better.  He has been trying not to overdo, but has been increasing his activity.  Yesterday, they were in and out of the house doing things around the yard.  He had been mowing with a riding lawnmower and rinsing down the driveway.  He had sudden onset of shortness of breath.  He feels the shortness of breath came first.  There was some associated chest pain, but the severity of the chest pain was nowhere near the severity of the shortness of breath.  His blood pressure was 93/56 with a heart rate of 95.  He took a sublingual nitroglycerin, and then took 4 baby aspirin as directed by EMS.  His wife notes that she did not see much improvement after the nitroglycerin with a baby aspirin.  EMS then arrived and put him on oxygen.  That seems to have made the most difference.  His symptoms gradually improved and resolved completely in about an hour.  He does not think he had SVT during this because his heart rate was not that high.  He felt that because he was getting more rest, his dyspnea on exertion was getting better.  He has not had chest pain except yesterday in the  setting of the severe shortness of breath.   Past Medical History:  Diagnosis Date   CAD (coronary artery disease) 10/30/2019   mild-mod by cardiac CT>>med rx   HTN (hypertension)    Hyperlipidemia LDL goal <70    OSA on CPAP 11/2019   SVT (supraventricular tachycardia) [facility])     Past Surgical History:  Procedure Laterality Date   CARDIAC CATHETERIZATION  10/30/2019   mild-mod dz>>med rx   VASECTOMY       Prior to Admission medications   Medication Sig Start Date End Date Taking? Authorizing Provider  amLODipine (NORVASC) 5 MG tablet Take 5 mg by mouth daily. 10/18/19  Yes [provider]  aspirin EC 81 MG tablet Take 1 tablet (81 mg total) by mouth daily. 10/31/19 10/30/20 Yes Arrien, Jimmy Picket, MD  aspirin-acetaminophen-caffeine (EXCEDRIN MIGRAINE) [phone] MG tablet Take 1 tablet by mouth every 6 (six) hours as needed for headache.   Yes [provider]  diclofenac (VOLTAREN) 75 MG EC tablet Take 75 mg by mouth 2 (two) times daily as needed for pain. 03/15/19 03/14/20 Yes [provider]  Docusate Sodium (STOOL SOFTENER) 100 MG capsule Take 100 mg by mouth daily.   Yes [provider]  ibuprofen (ADVIL) 200 MG tablet Take 400 mg by mouth every 6 (six) hours as needed for moderate pain.   Yes [provider]  lisinopril-hydrochlorothiazide (ZESTORETIC) 20-25  MG tablet Take 1 tablet by mouth daily. 10/18/19  Yes [provider]  Multiple Vitamin (MULTIVITAMIN) capsule Take 1 capsule by mouth daily.   Yes [provider]  nitroGLYCERIN (NITROSTAT) 0.4 MG SL tablet Place 1 tablet (0.4 mg total) under the tongue every 5 (five) minutes as needed for chest pain. 10/31/19  Yes Arrien, York Ram, MD  Omega 3 1200 MG CAPS Take 2 capsules by mouth daily.   Yes [provider]  pantoprazole (PROTONIX) 40 MG tablet Take 1 tablet (40 mg total) by mouth daily. 10/31/19  Yes Barrett, Joline Salt, PA-C  Probiotic  Product (PROBIOTIC PO) Take 1 tablet by mouth daily.   Yes [provider]    Inpatient Medications: Scheduled Meds:  aspirin EC  81 mg Oral Daily   enoxaparin (LOVENOX) injection  80 mg Subcutaneous Daily   Continuous Infusions:  PRN Meds:   Allergies:   No Known Allergies  Social History:   Social History   Socioeconomic History   Marital status: Married    Spouse name: Not on file   Number of children: Not on file   Years of education: Not on file   Highest education level: Not on file  Occupational History   Not on file  Tobacco Use   Smoking status: Current Every Day Smoker    Types: Cigars  Substance and Sexual Activity   Alcohol use: Not on file   Drug use: Not on file   Sexual activity: Not on file  Other Topics Concern   Not on file  Social History Narrative   Not on file   Social Determinants of Health   Financial Resource Strain:    Difficulty of Paying Living Expenses:   Food Insecurity:    Worried About Programme researcher, broadcasting/film/video in the Last Year:    Barista in the Last Year:   Transportation Needs:    Freight forwarder (Medical):    Lack of Transportation (Non-Medical):   Physical Activity:    Days of Exercise per Week:    Minutes of Exercise per Session:   Stress:    Feeling of Stress :   Social Connections:    Frequency of Communication with Friends and Family:    Frequency of Social Gatherings with Friends and Family:    Attends Religious Services:    Active Member of Clubs or Organizations:    Attends Engineer, structural:    Marital Status:   Intimate Partner Violence:    Fear of Current or Ex-Partner:    Emotionally Abused:    Physically Abused:    Sexually Abused:     Family History:   Family History  Problem Relation Age of Onset   Alzheimer's disease Mother    Hypertension Mother    Diabetes Mother    Failure to thrive Father    Family Status:  Family Status    Relation Name Status   Mother  Deceased   Father  Deceased at age 8   Sister  Alive   Brother  Alive    ROS:  Please see the history of present illness.  All other ROS reviewed and negative.     Physical Exam/Data:   Vitals:   11/12/19 0100 11/12/19 0135 11/12/19 0358 11/12/19 0740  BP: 110/67 124/81 124/68 106/77  Pulse: 75 77 77 (!) 57  Resp: 20 19 20 20   Temp: 98.3 F (36.8 C) 98.1 F (36.7 C) 98.2 F (36.8 C) 97.8  F (36.6 C)  TempSrc: Oral Oral Oral Oral  SpO2: 96% 97% 98% 100%  Weight:  (!) 170.1 kg (!) 170 kg   Height:  6\' 4"  (1.93 m)      Intake/Output Summary (Last 24 hours) at 11/12/2019 0951 Last data filed at 11/12/2019 0359 Gross per 24 hour  Intake --  Output 850 ml  Net -850 ml    Last 3 Weights 11/12/2019 11/12/2019 11/11/2019  Weight (lbs) 374 lb 12.5 oz 375 lb 1.6 oz 375 lb  Weight (kg) 170 kg 170.144 kg 170.099 kg     Body mass index is 45.62 kg/m.   General:  Well nourished, well developed, male in no acute distress HEENT: normal Lymph: no adenopathy Neck: JVD -not seen elevated, difficult to assess secondary to body habitus Endocrine:  No thryomegaly Vascular: No carotid bruits; 4/4 extremity pulses 2+  Cardiac:  normal S1, S2; RRR; no murmur Lungs:  clear bilaterally, no wheezing, rhonchi or rales  Abd: soft, nontender, no hepatomegaly  Ext: no edema Musculoskeletal:  No deformities, BUE and BLE strength normal and equal Skin: warm and dry  Neuro:  CNs 2-12 intact, no focal abnormalities noted Psych:  Normal affect   EKG:  The EKG was personally reviewed and demonstrates:  SR, HR 75, RBBB & LPFB Telemetry:  Telemetry was personally reviewed and demonstrates:  SR   CV studies:   ECHO: 10/30/2019 1. Left ventricular ejection fraction, by estimation, is 55 to 60%. The  left ventricle has normal function. Left ventricular endocardial border  not optimally defined to evaluate regional wall motion. Left ventricular  diastolic  parameters are consistent  with Grade I diastolic dysfunction (impaired relaxation).  2. Right ventricular systolic function is normal. The right ventricular  size is mildly enlarged. Tricuspid regurgitation signal is inadequate for  assessing PA pressure.  3. The mitral valve is normal in structure. Trivial mitral valve  regurgitation. No evidence of mitral stenosis.  4. The aortic valve was not well visualized. Aortic valve regurgitation  is not visualized. No aortic stenosis is present.  5. Aortic dilatation noted. There is mild dilatation of the aortic root  measuring 40 mm.  6. The inferior vena cava is dilated in size with <50% respiratory  variability, suggesting right atrial pressure of 15 mmHg.   CARDIAC CT: 10/31/2019  Coronary Arteries:  Normal coronary origin.  Right dominance.  RCA is a large dominant artery that gives rise to PDA and PLA. There are minimal luminal irregularities.  Left main is a large artery that gives rise to LAD and LCX arteries. Left main has no disease.  LAD is a large vessel that gives rise to a small diagonal artery. Proximal and mid LAD has mild to moderate diffuse mixed plaque with stenosis 25-49% and possibly 50-69% in the mid portion. Distal LAD has no significant disease.  LCX is a small non-dominant artery that has no obvious plaque.  Other findings:  Normal pulmonary vein drainage into the left atrium.  Normal left atrial appendage without a thrombus.  IMPRESSION: 1. Coronary calcium score of 56. This was 88 percentile for age and sex matched control.  2. Normal coronary origin with right dominance.  3. Study quality and interpretation affected by patient's size. CAD-RADS 3. Mild to moderate stenosis in the proximal and mid LAD. Consider symptom-guided anti-ischemic pharmacotherapy as well as aggressive risk factor modification per guideline directed care.  4. Mildly dilated pulmonary artery suggestive of  pulmonary hypertension.    Laboratory Data:  Chemistry Recent Labs  Lab 11/11/19 2057 11/12/19 0321  NA 138 140  K 3.3* 4.0  CL 102 108  CO2 20* 25  GLUCOSE 131* 105*  BUN 12 11  CREATININE 1.17 1.16  CALCIUM 9.0 9.0  GFRNONAA >60 >60  GFRAA >60 >60  ANIONGAP 16* 7    No results found for: ALT, AST, GGT, ALKPHOS, BILITOT Hematology Recent Labs  Lab 11/11/19 2057 11/12/19 0321  WBC 7.3 6.4  RBC 4.52 4.31  HGB 13.8 13.1  HCT 40.2 38.7*  MCV 88.9 89.8  MCH 30.5 30.4  MCHC 34.3 33.9  RDW 12.3 12.4  PLT 268 249   Cardiac Enzymes High Sensitivity Troponin:   Recent Labs  Lab 10/29/19 2200 10/29/19 2348 11/11/19 2057 11/11/19 2253  TROPONINIHS 8 7 8 7       BNPNo results for input(s): BNP, PROBNP in the last 168 hours.  DDimer  Recent Labs  Lab 11/11/19 2057  DDIMER 0.30   TSH: No results found for: TSH Lipids: Lab Results  Component Value Date   CHOL 167 10/31/2019   HDL 28 (L) 10/31/2019   LDLCALC 102 (H) 10/31/2019   TRIG 184 (H) 10/31/2019   CHOLHDL 6.0 10/31/2019   HgbA1c: Lab Results  Component Value Date   HGBA1C 6.0 (H) 10/30/2019   Magnesium:  Magnesium  Date Value Ref Range Status  11/12/2019 2.0 1.7 - 2.4 mg/dL Final    Comment:    Performed at [facility], [address], [place], [place]     Radiology/Studies:  DG Chest Portable 1 View  Result Date: 11/11/2019 CLINICAL DATA:  48 year old male with shortness of breath EXAM: PORTABLE CHEST 1 VIEW COMPARISON:  Chest radiograph dated 10/29/2019. FINDINGS: The lungs are clear. There is no pleural effusion or pneumothorax. Mild cardiomegaly. No acute osseous pathology. IMPRESSION: 1. No acute cardiopulmonary process. 2. Mild cardiomegaly. Electronically Signed   By: 10/31/2019 M.D.   On: 11/11/2019 21:35    Assessment and Plan:   1.  Shortness of breath: -The cause is unclear.  Cardiac enzymes are negative for MI and chest x-ray as well as physical exam  is without any significant abnormality. -The symptoms seem to be helped by oxygen. -D-dimer was within normal limits. -Blood pressure was low normal during the episode with a systolic blood pressure and heart rate both in the 90s. - he did not have any palpitations during this, but I wonder about SVT causing the symptoms -Also, he has a history of reflux.  The reflux has improved because now he is sleeping with his head slightly elevated. -I wonder about something causing laryngeal spasm as an etiology for the shortness of breath -SBP was in the 90s when he got here with MAP in the 50s.  He denies orthostatic symptoms and says his p.o. intake was good yesterday. -MD to review CT results and advise if any further ischemic evaluation is indicated -Consider adding Imdur for possible spasm  Otherwise, per IM.  Consider decreasing his Zestoretic to allow blood pressure to float a little higher Active Problems:   Hypotension     For questions or updates, please contact [facility] Please consult [email or web address] for contact info under Cardiology/STEMI.   Signed07/02/2020, PA-C  11/12/2019 9:51 AM   Attending note  Patient seen and discussed with PA Barrett, I agree with her documentation above. 49 yo male history of HTN, obesity, OSA, PSVT just admitted 10/2019 with chest pains. Presents with episode of  SOB, chest pain, diaphoresis, dizziness that happened while outside doing some work. He mainly recalls the SOB, does not recall pain but his wife who witnessed the epsiode reports he did indicate he was having chest pain. Not better with NG. Better with O2 placed by EMS.     WBC 7.3 Hgb 13.8 Plt 268 K 3.3 Cr 1.17 Mg 2  hstrop 8-->7--> COVID neg DDimer 30 EKG SR, RBBB, RAD CXR no acute process  10/2019 [facility] holter: PSVT up to 203  10/2019 Echo: LVEF 55-60%, grade I DDx, normal RV fucntion with mild RV enlargement, cannot estimated PASP  10/2019 coronary CTA Coronary CA score 56,  mild to mod stenosis prox and mid LAD, mildly dilated PA CT with air fluid levels esophagus suggesting dysmotility or reflux I reviewed his echo, RV poorly visualized. Objective TAPSE and RV s' support normal systolic function, in some views looks enlarged.    Unclear etiology of episode. Extensive workup just last week with echo and coronary CTA that were overall bengin, Ddimer is negative here. Unclear if could have been related to an SVT which he has history of (low K on admission increasing potential), perhaps coronary vasospasm, or noncardiac cause. No plans for repeat ischemic testing. Would d/c HCTZ, start lopressor 12.5mg  bid for possible symptomatic SVT, continue norvasc 5mg  daily for potential vasospasm and may consider uptitation pending bp's. Since stopping HCTZ would prescibe lasix 20mg  prn swelling. [facility]  Follow rates and bp's with afternoon lopressor, pending numbers could add back his norvasc at current or reduced dose. If ongoing soft bp's would continue to hold his lisinopril/HCTZ. We do not plan on repeat cardiac testing, consideration for discharge would be up to primary team pending bp's  Dina RichJonathan Marshell Dilauro MD

## 2019-11-12 NOTE — Progress Notes (Signed)
PROGRESS NOTE    Edward Obrien  DTO:[id number] DOB: 11-11-1970 DOA: 11/11/2019 PCP: Edward Inch, MD   Brief Narrative:  Edward Obrien is a very pleasant 49 yo CM with PMH obesity, HTN, HLD, OSA (now on CPAP), SVTs who was recently admitted 4/26-4/27 for chest pain. 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 Echo also noted EF 55-60%, Gr 1 dd and enlarged right heart. He underwent a sleep study outpatient after discharge and has been on CPAP now at home for his OSA and says he's slept better the last 3-4 nights than he ever has his entire life.   Prior to admission he was at home mowing the lawn on his riding lawnmower as he typically does.  He very acutely became short of breath.  His wife was next to him and states that he grabbed his chest when he had this sensation.  It lasted approximately 30 minutes until EMS had placed oxygen on him which helped his symptoms.  Of note, he had also taken one of his nitroglycerin tablets as well as 4 baby aspirin per advice from 911 which did not provide any significant relief.  His wife also notes that he was diaphoretic despite his activity being very nonexertional as they were taking their time with activities this morning.  He denied any nausea/vomiting.  When asked to describe the sensation on his chest he states that it just felt like "pressure".  It was similar to the shortness of breath he had previously when presenting on 10/30/2019 but states that it was "far worse". He was hypotensive in the ER and despite a walk test, he remained short of breath and hypotensive.  Therefore, he was admitted for further work-up and monitoring overnight. He has been compliant with his home medication regimen with no confusion.  There were no adjustments to his blood pressure regimen at recent discharge either.  He remains on lisinopril-HCTZ and  amlodipine at his previous doses.   Assessment & Plan:   Active Problems:   Hypotension  Acute onset symptomatic hypotension -Cardiology following, appreciate insight recommendations -Continue to hold blood pressure medications as patient's blood pressure is currently well controlled off of all home medications -we will see where he lands in the next 24 to 48 hours and then slowly resume medications as indicated -Does not appear to be ACS in nature given symptoms, negative troponin and EKG. - Unclear if tachyarrhythmia or brady-arrhythmia at this point the patient denies palpitations -Continue on telemetry and follow clinically, appears to be resolved at this point  Acute dyspnea with questionably transient hypoxia -Occurred simultaneously with likely hypotension as above, cannot rule out arrhythmia -Patient on 2 L nasal cannula at admission, unclear if patient was truly hypoxic or oxygen was simply for symptomatic control  OSA -Continue CPAP whenever sleeping  HTN - see hypotension above -currently holding home medications  Prediabetes - patient has adapted some lifestyle changes at home; continue encouragement   DVT prophylaxis: Lovenox Code Status: Full Family Communication: None present, patient to update wife  Status is: Transition to inpatient given ongoing work-up required  Dispo: The patient is from: Home              Anticipated d/c is to: Home              Anticipated d/c date is: 24 to 48 hours  Patient currently not medically stable for discharge, continues to require further evaluation with cardiology and close monitoring in the setting of above.  Consultants:   Cardiology  Procedures:   None  Antimicrobials:  None indicated  Subjective: Symptoms resolved overnight, otherwise denies any chest pain, shortness of breath nausea, vomiting, diarrhea, constipation, headache, fevers, chills.  Objective: Vitals:   11/12/19 0100 11/12/19  0135 11/12/19 0358 11/12/19 0740  BP: 110/67 124/81 124/68 106/77  Pulse: 75 77 77 (!) 57  Resp: 20 19 20 20   Temp: 98.3 F (36.8 C) 98.1 F (36.7 C) 98.2 F (36.8 C) 97.8 F (36.6 C)  TempSrc: Oral Oral Oral Oral  SpO2: 96% 97% 98% 100%  Weight:  (!) 170.1 kg (!) 170 kg   Height:  6\' 4"  (1.93 m)      Intake/Output Summary (Last 24 hours) at 11/12/2019 1139 Last data filed at 11/12/2019 0359 Gross per 24 hour  Intake -  Output 850 ml  Net -850 ml   Filed Weights   11/11/19 2038 11/12/19 0135 11/12/19 0358  Weight: (!) 170.1 kg (!) 170.1 kg (!) 170 kg    Examination:  General:  Pleasantly resting in bed, No acute distress. HEENT:  Normocephalic atraumatic.  Sclerae nonicteric, noninjected.  Extraocular movements intact bilaterally. Neck:  Without mass or deformity.  Trachea is midline. Lungs:  Clear to auscultate bilaterally without rhonchi, wheeze, or rales. Heart:  Regular rate and rhythm.  Without murmurs, rubs, or gallops. Abdomen:  Soft, nontender, nondistended.  Without guarding or rebound. Extremities: Without cyanosis, clubbing, edema, or obvious deformity. Vascular:  Dorsalis pedis and posterior tibial pulses palpable bilaterally. Skin:  Warm and dry, no erythema, no ulcerations.  Data Reviewed: I have personally reviewed following labs and imaging studies  CBC: Recent Labs  Lab 11/11/19 2057 11/12/19 0321  WBC 7.3 6.4  NEUTROABS 4.6  --   HGB 13.8 13.1  HCT 40.2 38.7*  MCV 88.9 89.8  PLT 268 938   Basic Metabolic Panel: Recent Labs  Lab 11/11/19 2057 11/12/19 0321  NA 138 140  K 3.3* 4.0  CL 102 108  CO2 20* 25  GLUCOSE 131* 105*  BUN 12 11  CREATININE 1.17 1.16  CALCIUM 9.0 9.0  MG 2.0 2.0   GFR: Estimated Creatinine Clearance: 129.4 mL/min (by C-G formula based on SCr of 1.16 mg/dL). Liver Function Tests: No results for input(s): AST, ALT, ALKPHOS, BILITOT, PROT, ALBUMIN in the last 168 hours. No results for input(s): LIPASE, AMYLASE in  the last 168 hours. No results for input(s): AMMONIA in the last 168 hours. Coagulation Profile: Recent Labs  Lab 11/11/19 2057  INR 1.1   Cardiac Enzymes: No results for input(s): CKTOTAL, CKMB, CKMBINDEX, TROPONINI in the last 168 hours. BNP (last 3 results) No results for input(s): PROBNP in the last 8760 hours. HbA1C: No results for input(s): HGBA1C in the last 72 hours. CBG: No results for input(s): GLUCAP in the last 168 hours. Lipid Profile: No results for input(s): CHOL, HDL, LDLCALC, TRIG, CHOLHDL, LDLDIRECT in the last 72 hours. Thyroid Function Tests: No results for input(s): TSH, T4TOTAL, FREET4, T3FREE, THYROIDAB in the last 72 hours. Anemia Panel: No results for input(s): VITAMINB12, FOLATE, FERRITIN, TIBC, IRON, RETICCTPCT in the last 72 hours. Sepsis Labs: No results for input(s): PROCALCITON, LATICACIDVEN in the last 168 hours.  Recent Results (from the past 240 hour(s))  Respiratory Panel by RT PCR (Flu A&B, Covid) - Nasopharyngeal Swab     Status: None  Collection Time: 11/12/19 12:22 AM   Specimen: Nasopharyngeal Swab  Result Value Ref Range Status   SARS Coronavirus 2 by RT PCR NEGATIVE NEGATIVE Final    Comment: (NOTE) SARS-CoV-2 target nucleic acids are NOT DETECTED. The SARS-CoV-2 RNA is generally detectable in upper respiratoy specimens during the acute phase of infection. The lowest concentration of SARS-CoV-2 viral copies this assay can detect is 131 copies/mL. A negative result does not preclude SARS-Cov-2 infection and should not be used as the sole basis for treatment or other patient management decisions. A negative result may occur with  improper specimen collection/handling, submission of specimen other than nasopharyngeal swab, presence of viral mutation(s) within the areas targeted by this assay, and inadequate number of viral copies (<131 copies/mL). A negative result must be combined with clinical observations, patient history, and  epidemiological information. The expected result is Negative. Fact Sheet for Patients:  https://www.moore.com/ Fact Sheet for Healthcare Providers:  https://www.young.biz/ This test is not yet ap proved or cleared by the [place] FDA and  has been authorized for detection and/or diagnosis of SARS-CoV-2 by FDA under an Emergency Use Authorization (EUA). This EUA will remain  in effect (meaning this test can be used) for the duration of the COVID-19 declaration under Section 564(b)(1) of the Act, 21 U.S.C. section 360bbb-3(b)(1), unless the authorization is terminated or revoked sooner.    Influenza A by PCR NEGATIVE NEGATIVE Final   Influenza B by PCR NEGATIVE NEGATIVE Final    Comment: (NOTE) The Xpert Xpress SARS-CoV-2/FLU/RSV assay is intended as an aid in  the diagnosis of influenza from Nasopharyngeal swab specimens and  should not be used as a sole basis for treatment. Nasal washings and  aspirates are unacceptable for Xpert Xpress SARS-CoV-2/FLU/RSV  testing. Fact Sheet for Patients: https://www.moore.com/ Fact Sheet for Healthcare Providers: https://www.young.biz/ This test is not yet approved or cleared by the [place] FDA and  has been authorized for detection and/or diagnosis of SARS-CoV-2 by  FDA under an Emergency Use Authorization (EUA). This EUA will remain  in effect (meaning this test can be used) for the duration of the  Covid-19 declaration under Section 564(b)(1) of the Act, 21  U.S.C. section 360bbb-3(b)(1), unless the authorization is  terminated or revoked. Performed at [facility], [address], [place], [place] [address]          Radiology Studies: DG Chest Portable 1 View  Result Date: 11/11/2019 CLINICAL DATA:  49 year old male with shortness of breath EXAM: PORTABLE CHEST 1 VIEW COMPARISON:  Chest radiograph dated 10/29/2019. FINDINGS: The lungs are  clear. There is no pleural effusion or pneumothorax. Mild cardiomegaly. No acute osseous pathology. IMPRESSION: 1. No acute cardiopulmonary process. 2. Mild cardiomegaly. Electronically Signed   By: Elgie Collard M.D.   On: 11/11/2019 21:35        Scheduled Meds: . aspirin EC  81 mg Oral Daily  . enoxaparin (LOVENOX) injection  80 mg Subcutaneous Daily   Continuous Infusions:   LOS: 0 days   Time spent:  Azucena Fallen, DO [facility]  If 7PM-7AM, please contact night-coverage [email or web address]  11/12/2019, 11:39 AM

## 2019-11-12 NOTE — Hospital Course (Signed)
Edward Obrien is a very pleasant 49 yo CM with PMH obesity, HTN, HLD, OSA (now on CPAP), SVTs who was recently admitted 4/26-4/27 for chest pain. 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 Echo also noted EF 55-60%, Gr 1 dd and enlarged right heart. He underwent a sleep study outpatient after discharge and has been on CPAP now at home for his OSA and says he's slept better the last 3-4 nights than he ever has his entire life.   Today, he was at home mowing the lawn on his riding lawnmower as he typically does.  He very acutely became short of breath.  His wife was next to him and states that he grabbed his chest when he had this sensation.  It lasted approximately 30 minutes until EMS had placed oxygen on him which helped his symptoms.  Of note, he had also taken one of his nitroglycerin tablets as well as 4 baby aspirin per advice from 911 which did not provide any significant relief.  His wife also notes that he was diaphoretic despite his activity being very nonexertional as they were taking their time with activities this morning.  He denied any nausea/vomiting.  When asked to describe the sensation on his chest he states that it just felt like "pressure".  It was similar to the shortness of breath he had previously when presenting on 10/30/2019 but states that it was "far worse". Also since he has been wearing his CPAP, he has noted that his blood pressure has been slowly downtrending.  Typically he has pressures in the 130s over 90s and this morning noted that it had overall down trended since starting on CPAP therapy.  He keeps a blood pressure log on his phone which he was able to easily refer to tonight. He was hypotensive in the ER and despite a walk test, he remained short of breath and hypotensive.  Therefore, he was admitted for further work-up and monitoring overnight. He has been  compliant with his home medication regimen with no confusion.  There were no adjustments to his blood pressure regimen at recent discharge either.  He remains on lisinopril-HCTZ and amlodipine at his previous doses.

## 2019-11-12 NOTE — ED Notes (Signed)
Attempted to call report x 1  

## 2019-11-13 ENCOUNTER — Encounter (HOSPITAL_COMMUNITY): Payer: Self-pay | Admitting: Internal Medicine

## 2019-11-13 DIAGNOSIS — I959 Hypotension, unspecified: Principal | ICD-10-CM

## 2019-11-13 DIAGNOSIS — R079 Chest pain, unspecified: Secondary | ICD-10-CM

## 2019-11-13 DIAGNOSIS — R0600 Dyspnea, unspecified: Secondary | ICD-10-CM | POA: Diagnosis not present

## 2019-11-13 LAB — CBC
HCT: 38.4 % — ABNORMAL LOW (ref 39.0–52.0)
Hemoglobin: 13 g/dL (ref 13.0–17.0)
MCH: 30.7 pg (ref 26.0–34.0)
MCHC: 33.9 g/dL (ref 30.0–36.0)
MCV: 90.8 fL (ref 80.0–100.0)
Platelets: 226 10*3/uL (ref 150–400)
RBC: 4.23 MIL/uL (ref 4.22–5.81)
RDW: 12.4 % (ref 11.5–15.5)
WBC: 5.9 10*3/uL (ref 4.0–10.5)
nRBC: 0 % (ref 0.0–0.2)

## 2019-11-13 LAB — COMPREHENSIVE METABOLIC PANEL
ALT: 30 U/L (ref 0–44)
AST: 26 U/L (ref 15–41)
Albumin: 3.3 g/dL — ABNORMAL LOW (ref 3.5–5.0)
Alkaline Phosphatase: 44 U/L (ref 38–126)
Anion gap: 9 (ref 5–15)
BUN: 12 mg/dL (ref 6–20)
CO2: 22 mmol/L (ref 22–32)
Calcium: 9 mg/dL (ref 8.9–10.3)
Chloride: 105 mmol/L (ref 98–111)
Creatinine, Ser: 0.96 mg/dL (ref 0.61–1.24)
GFR calc Af Amer: >60 mL/min (ref >60)
GFR calc non Af Amer: >60 mL/min (ref >60)
Glucose, Bld: 105 mg/dL — ABNORMAL HIGH (ref 70–99)
Potassium: 4.2 mmol/L (ref 3.5–5.1)
Sodium: 136 mmol/L (ref 135–145)
Total Bilirubin: 0.8 mg/dL (ref 0.3–1.2)
Total Protein: 5.8 g/dL — ABNORMAL LOW (ref 6.5–8.1)

## 2019-11-13 MED ORDER — METOPROLOL TARTRATE 25 MG PO TABS: 12.5000 mg | ORAL_TABLET | Freq: Two times a day (BID) | ORAL | 0 refills | Status: DC

## 2019-11-13 NOTE — Progress Notes (Addendum)
Progress Note  Patient Name: Edward Obrien Date of Encounter: 11/13/2019  Primary Cardiologist: Nolene Ebbs, MD   Subjective   Patient feels breathing is back to baseline. He walked up and down the halls with no symptoms. No SVT seen on tele.  Inpatient Medications    Scheduled Meds: . aspirin EC  81 mg Oral Daily  . enoxaparin (LOVENOX) injection  80 mg Subcutaneous Daily  . metoprolol tartrate  12.5 mg Oral BID  . pantoprazole  40 mg Oral Daily   Continuous Infusions:   PRN Meds: diclofenac   Vital Signs    Vitals:   11/12/19 2123 11/13/19 0429 11/13/19 0725 11/13/19 0730  BP: 124/86 108/78 131/81 126/87  Pulse: 65   67  Resp: 20 16    Temp: 98 F (36.7 C) 98.5 F (36.9 C)    TempSrc: Oral Oral    SpO2:    97%  Weight:  (!) 170 kg    Height:       No intake or output data in the 24 hours ending 11/13/19 0824 Last 3 Weights 11/13/2019 11/12/2019 11/12/2019  Weight (lbs) 374 lb 11.2 oz 374 lb 12.5 oz 375 lb 1.6 oz  Weight (kg) 169.963 kg 170 kg 170.144 kg      Telemetry    NSR, HR 60s down to upper 40s overnight - Personally Reviewed  ECG    No new - Personally Reviewed  Physical Exam   GEN: No acute distress.   Neck: No JVD Cardiac: RRR, no murmurs, rubs, or gallops.  Respiratory: Clear to auscultation bilaterally. GI: Soft, nontender, non-distended  MS: Trace edema; No deformity. Neuro:  Nonfocal  Psych: Normal affect   Labs    High Sensitivity Troponin:   Recent Labs  Lab 10/29/19 2200 10/29/19 2348 11/11/19 2057 11/11/19 2253  TROPONINIHS 8 7 8 7       Chemistry Recent Labs  Lab 11/11/19 2057 11/12/19 0321 11/13/19 0504  NA 138 140 136  K 3.3* 4.0 4.2  CL 102 108 105  CO2 20* 25 22  GLUCOSE 131* 105* 105*  BUN 12 11 12   CREATININE 1.17 1.16 0.96  CALCIUM 9.0 9.0 9.0  PROT  --   --  5.8*  ALBUMIN  --   --  3.3*  AST  --   --  26  ALT  --   --  30  ALKPHOS  --   --  44  BILITOT  --   --  0.8  GFRNONAA >60 >60  >60  GFRAA >60 >60 >60  ANIONGAP 16* 7 9     Hematology Recent Labs  Lab 11/11/19 2057 11/12/19 0321 11/13/19 0504  WBC 7.3 6.4 5.9  RBC 4.52 4.31 4.23  HGB 13.8 13.1 13.0  HCT 40.2 38.7* 38.4*  MCV 88.9 89.8 90.8  MCH 30.5 30.4 30.7  MCHC 34.3 33.9 33.9  RDW 12.3 12.4 12.4  PLT 268 249 226    BNPNo results for input(s): BNP, PROBNP in the last 168 hours.   DDimer  Recent Labs  Lab 11/11/19 2057  DDIMER 0.30     Radiology    DG Chest Portable 1 View  Result Date: 11/11/2019 CLINICAL DATA:  49 year old male with shortness of breath EXAM: PORTABLE CHEST 1 VIEW COMPARISON:  Chest radiograph dated 10/29/2019. FINDINGS: The lungs are clear. There is no pleural effusion or pneumothorax. Mild cardiomegaly. No acute osseous pathology. IMPRESSION: 1. No acute cardiopulmonary process. 2. Mild cardiomegaly. Electronically Signed  By: Elgie Collard M.D.   On: 11/11/2019 21:35    Cardiac Studies   Echo 10/30/19 1. Left ventricular ejection fraction, by estimation, is 55 to 60%. The  left ventricle has normal function. Left ventricular endocardial border  not optimally defined to evaluate regional wall motion. Left ventricular  diastolic parameters are consistent  with Grade I diastolic dysfunction (impaired relaxation).   2. Right ventricular systolic function is normal. The right ventricular  size is mildly enlarged. Tricuspid regurgitation signal is inadequate for  assessing PA pressure.   3. The mitral valve is normal in structure. Trivial mitral valve  regurgitation. No evidence of mitral stenosis.   4. The aortic valve was not well visualized. Aortic valve regurgitation  is not visualized. No aortic stenosis is present.   5. Aortic dilatation noted. There is mild dilatation of the aortic root  measuring 40 mm.   6. The inferior vena cava is dilated in size with <50% respiratory  variability, suggesting right atrial pressure of 15 mmHg.   Coronary CT  10/31/19 IMPRESSION: 1. Coronary calcium score of 56. This was 30 percentile for age and sex matched control.   2. Normal coronary origin with right dominance.   3. Study quality and interpretation affected by patient's size. CAD-RADS 3. Mild to moderate stenosis in the proximal and mid LAD. Consider symptom-guided anti-ischemic pharmacotherapy as well as aggressive risk factor modification per guideline directed care.   4. Mildly dilated pulmonary artery suggestive of pulmonary hypertension.      Patient Profile     49 y.o. 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 SOB with hypotension placed on 2L O2. - Troponin is negative and CXR is clear.  - D-dimer normal - BP has been low normal - H/o has history of GERD - Holter monitor 10/2019 showed holter monitor PSVT up to 203 - Echo 10/2019 showed EF 55-60%, G1DD, normal RV function with mild RV enlargement - Coronary CT with calcium score of 56, mild to mod stenosis prox and mid LAD, mildly dilated PA, and air fluid levels esophagus suggesting dysmotility or reflux.  - No plans for further ischemic work-up at this time - Pressures have since normalized - BB started for possible symptomatic SVT. Patient reports he feel back to baseline  HTN - Rx only with lopressor given SVT d/c norvasc and zetoretic  SVT - Lopressor 12.5mg  BID - no episodes on tele  Chronic diastolic HF - low sodium diet f/u [place] stopping diuretic for now given soft BP  For questions or updates, please contact [facility] Please consult [email or web address] for contact info under        Signed, Cadence H Furth, PA-C  11/13/2019, 8:24 AM    No complaints going home today continue beta blocker for SVT. D/c zestoretic and norvasc   Charlton Haws MD [facility]

## 2019-11-13 NOTE — Discharge Summary (Signed)
Physician Discharge Summary  Edward Obrien EPP:[id number] DOB: [birth year] DOA: 11/11/2019  PCP: Chesley Noon, MD  Admit date: 11/11/2019 Discharge date: 11/13/2019  Admitted From: Home Disposition: Home  Recommendations for Outpatient Follow-up:  1. Follow up with PCP in 1-2 weeks 2. Please obtain BMP/CBC in one week 3. Please follow up on the following pending results:  Discharge Condition: Stable CODE STATUS: Full Diet recommendation: Cardiac low-salt low-fat diet  Brief/Interim Summary: [name] is a very pleasant 49 yo CM with PMH obesity, HTN, HLD, OSA (now on CPAP), SVTs who was recently admitted 4/26-4/27 for chest pain. 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 Echo also noted EF 55-60%, Gr 1 dd and enlarged right heart. He underwent a sleep study outpatient after discharge and has been on CPAP now at home for his OSA and says he's slept better the last 3-4 nights than he ever has his entire life. Patient re-presented to ED after shortness of breath/transient episode of chest tightness/pain. He was at home mowing the lawn on his riding lawnmower when the event occurred lasting approximately 30 minutes until EMS had placed oxygen on him which helped his symptoms. Of note, he had also taken one of his nitroglycerin tablets as well as 4 baby aspirin per advice from 911 which did not provide any significant relief. His wife also notes that he was diaphoretic despite his activity being very nonexertional as they were taking their time with activities this morning. He denied any nausea/vomiting. When asked to describe the sensation on his chest he states that it just felt like "pressure". It was similar to the shortness of breath he had previously when presenting on 10/30/2019 but states that it was "far worse". He was hypotensive in the ER and despite a walk test, he  remained short of breath and hypotensive. Therefore, he was admitted for further work-up and monitoring.   Patient admitted as above with atypical shortness of breath, chest tightness, and questionable pain.  Evaluated by cardiology, given recent evaluation at previous hospitalization with cardiac CTA without severe disease there is discussion this was likely a tachycardia or bradycardic event versus Prinzmetal's angina given its fleeting nature.  Patient's blood pressure medications have been held at admission due to hypotension, now resolving improving.  Previous Holter monitor did show PSVT up to 203, echocardiogram shows grade 1 diastolic dysfunction, given negative troponin EKG and resolved symptoms no further work-up for ischemic changes indicated.  Transition patient to low-dose beta-blocker to hopefully alleviate recurrent tachyarrhythmia.  Patient otherwise stable and agreeable for discharge, close follow-up with cardiology and PCP as scheduled.  Discharge Diagnoses:  Active Problems:   Chest pain   Exertional dyspnea   OSA (obstructive sleep apnea)   Morbid obesity (HCC)   Hypotension  Discharge Instructions  Discharge Instructions    Call MD for:  difficulty breathing, headache or visual disturbances   Complete by: As directed    Call MD for:  extreme fatigue   Complete by: As directed    Call MD for:  hives   Complete by: As directed    Call MD for:  persistant dizziness or light-headedness   Complete by: As directed    Call MD for:  persistant nausea and vomiting   Complete by: As directed    Call MD for:  severe uncontrolled pain   Complete by: As directed  Call MD for:  temperature >100.4   Complete by: As directed    Diet - low sodium heart healthy   Complete by: As directed    Increase activity slowly   Complete by: As directed      Allergies as of 11/13/2019   No Known Allergies     Medication List    STOP taking these medications   amLODipine 5 MG  tablet Commonly known as: NORVASC   ibuprofen 200 MG tablet Commonly known as: ADVIL   lisinopril-hydrochlorothiazide 20-25 MG tablet Commonly known as: ZESTORETIC     TAKE these medications   aspirin EC 81 MG tablet Take 1 tablet (81 mg total) by mouth daily.   aspirin-acetaminophen-caffeine 250-250-65 MG tablet Commonly known as: EXCEDRIN MIGRAINE Take 1 tablet by mouth every 6 (six) hours as needed for headache.   diclofenac 75 MG EC tablet Commonly known as: VOLTAREN Take 75 mg by mouth 2 (two) times daily as needed for pain.   metoprolol tartrate 25 MG tablet Commonly known as: LOPRESSOR Take 0.5 tablets (12.5 mg total) by mouth 2 (two) times daily.   multivitamin capsule Take 1 capsule by mouth daily.   nitroGLYCERIN 0.4 MG SL tablet Commonly known as: NITROSTAT Place 1 tablet (0.4 mg total) under the tongue every 5 (five) minutes as needed for chest pain.   Omega 3 1200 MG Caps Take 2 capsules by mouth daily.   pantoprazole 40 MG tablet Commonly known as: Protonix Take 1 tablet (40 mg total) by mouth daily.   PROBIOTIC PO Take 1 tablet by mouth daily.   Stool Softener 100 MG capsule Generic drug: Docusate Sodium Take 100 mg by mouth daily.       No Known Allergies  Consultations:  Cardiology, [facility]  Procedures/Studies: DG Chest 2 View  Result Date: 10/29/2019 CLINICAL DATA:  Chest pain EXAM: CHEST - 2 VIEW COMPARISON:  None. FINDINGS: The heart size and mediastinal contours are within normal limits. Both lungs are clear. The visualized skeletal structures are unremarkable. IMPRESSION: No active cardiopulmonary disease. Electronically Signed   By: Sharlet Salina M.D.   On: 10/29/2019 22:23   CT CORONARY MORPH W/CTA COR W/SCORE W/CA W/CM &/OR WO/CM  Addendum Date: 10/31/2019   ADDENDUM REPORT: 10/31/2019 13:30 CLINICAL DATA:  49 year old male with morbid obesity, hyperlipidemia and chest pain. EXAM: Cardiac/Coronary  CTA TECHNIQUE: The patient was  scanned on a [employer]. FINDINGS: A 100 kV prospective scan was triggered in the descending thoracic aorta at 111 HU's. Axial non-contrast 3 mm slices were carried out through the heart. The data set was analyzed on a dedicated work station and scored using the Agatson method. Gantry rotation speed was 250 msecs and collimation was .6 mm. No beta blockade and 0.8 mg of sl NTG was given. The 3D data set was reconstructed in 5% intervals of the 67-82 % of the R-R cycle. Diastolic phases were analyzed on a dedicated work station using MPR, MIP and VRT modes. The patient received 80 cc of contrast. Aorta: Normal size. Minimal diffuse atherosclerotic plaque, no calcifications. No dissection. Aortic Valve:  Trileaflet.  No calcifications. Coronary Arteries:  Normal coronary origin.  Right dominance. RCA is a large dominant artery that gives rise to PDA and PLA. There are minimal luminal irregularities. Left main is a large artery that gives rise to LAD and LCX arteries. Left main has no disease. LAD is a large vessel that gives rise to a small diagonal artery. Proximal and mid LAD  has mild to moderate diffuse mixed plaque with stenosis 25-49% and possibly 50-69% in the mid portion. Distal LAD has no significant disease. LCX is a small non-dominant artery that has no obvious plaque. Other findings: Normal pulmonary vein drainage into the left atrium. Normal left atrial appendage without a thrombus. IMPRESSION: 1. Coronary calcium score of 56. This was 56 percentile for age and sex matched control. 2. Normal coronary origin with right dominance. 3. Study quality and interpretation affected by patient's size. CAD-RADS 3. Mild to moderate stenosis in the proximal and mid LAD. Consider symptom-guided anti-ischemic pharmacotherapy as well as aggressive risk factor modification per guideline directed care. 4. Mildly dilated pulmonary artery suggestive of pulmonary hypertension. Electronically Signed   By: Tobias Alexander   On: 10/31/2019 13:30   Result Date: 10/31/2019 EXAM: OVER-READ INTERPRETATION  CT CHEST The following report is an over-read performed by radiologist Dr. Jeronimo Greaves of [facility] on 10/31/2019. This over-read does not include interpretation of cardiac or coronary anatomy or pathology. The coronary CTA interpretation by the cardiologist is attached. COMPARISON:  Chest radiograph 10/29/2019 FINDINGS: Vascular: Normal aortic caliber. No dissection. No central pulmonary embolism, on this non-dedicated study. Mediastinum/Nodes: No imaged thoracic adenopathy. The esophagus is mildly dilated with a fluid level within, including on 42/8. Lungs/Pleura: No pleural fluid. Clear imaged lungs. Upper Abdomen: Normal imaged portions of the liver, spleen, stomach. Musculoskeletal: No acute osseous abnormality. IMPRESSION: 1. No acute process in the chest. 2. Esophageal air fluid level suggests dysmotility or gastroesophageal reflux. Electronically Signed: By: Jeronimo Greaves M.D. On: 10/31/2019 12:18   DG Chest Portable 1 View  Result Date: 11/11/2019 CLINICAL DATA:  49 year old male with shortness of breath EXAM: PORTABLE CHEST 1 VIEW COMPARISON:  Chest radiograph dated 10/29/2019. FINDINGS: The lungs are clear. There is no pleural effusion or pneumothorax. Mild cardiomegaly. No acute osseous pathology. IMPRESSION: 1. No acute cardiopulmonary process. 2. Mild cardiomegaly. Electronically Signed   By: Elgie Collard M.D.   On: 11/11/2019 21:35   ECHOCARDIOGRAM COMPLETE  Result Date: 10/30/2019    ECHOCARDIOGRAM REPORT   Patient Name:   MYCAH FORMICA Date of Exam: 10/30/2019 Medical Rec #:  [id number]       Height:       76.0 in Accession #:    [id number]      Weight:       375.0 lb Date of Birth:  [birth year]        BSA:          2.894 m Patient Age:    48 years        BP:           108/69 mmHg Patient Gender: M               HR:           62 bpm. Exam Location:  Inpatient Procedure: 2D Echo, Cardiac  Doppler and Color Doppler Indications:    R07.9* Chest pain, unspecified  History:        Patient has no prior history of Echocardiogram examinations.  Sonographer:    Elmarie Shiley Dance Referring Phys: [id number] [facility] Z Welton Flakes  Sonographer Comments: Technically difficult study due to poor echo windows. IMPRESSIONS  1. Left ventricular ejection fraction, by estimation, is 55 to 60%. The left ventricle has normal function. Left ventricular endocardial border not optimally defined to evaluate regional wall motion. Left ventricular diastolic parameters are consistent with Grade I diastolic dysfunction (impaired relaxation).  2.  Right ventricular systolic function is normal. The right ventricular size is mildly enlarged. Tricuspid regurgitation signal is inadequate for assessing PA pressure.  3. The mitral valve is normal in structure. Trivial mitral valve regurgitation. No evidence of mitral stenosis.  4. The aortic valve was not well visualized. Aortic valve regurgitation is not visualized. No aortic stenosis is present.  5. Aortic dilatation noted. There is mild dilatation of the aortic root measuring 40 mm.  6. The inferior vena cava is dilated in size with <50% respiratory variability, suggesting right atrial pressure of 15 mmHg. FINDINGS  Left Ventricle: LV wall thickness not well visualized for measurement, appears grossly normal in other views. Left ventricular ejection fraction, by estimation, is 55 to 60%. The left ventricle has normal function. Left ventricular endocardial border not optimally defined to evaluate regional wall motion. The left ventricular internal cavity size was normal in size. Left ventricular diastolic parameters are consistent with Grade I diastolic dysfunction (impaired relaxation). Right Ventricle: The right ventricular size is mildly enlarged. No increase in right ventricular wall thickness. Right ventricular systolic function is normal. Tricuspid regurgitation signal is inadequate for  assessing PA pressure. Left Atrium: Left atrial size was normal in size. Right Atrium: Right atrial size was normal in size. Pericardium: There is no evidence of pericardial effusion. Presence of pericardial fat pad. Mitral Valve: The mitral valve is normal in structure. Normal mobility of the mitral valve leaflets. Trivial mitral valve regurgitation. No evidence of mitral valve stenosis. Tricuspid Valve: The tricuspid valve is normal in structure. Tricuspid valve regurgitation is not demonstrated. No evidence of tricuspid stenosis. Aortic Valve: The aortic valve was not well visualized. Aortic valve regurgitation is not visualized. No aortic stenosis is present. Pulmonic Valve: The pulmonic valve was not well visualized. Pulmonic valve regurgitation is trivial. No evidence of pulmonic stenosis. Aorta: The aortic root was not well visualized and aortic dilatation noted. There is mild dilatation of the aortic root measuring 40 mm. Venous: The inferior vena cava was not well visualized. The inferior vena cava is dilated in size with less than 50% respiratory variability, suggesting right atrial pressure of 15 mmHg. IAS/Shunts: The interatrial septum was not well visualized.  LEFT VENTRICLE PLAX 2D LVIDd:         5.13 cm  Diastology LVIDs:         4.58 cm  LV e' lateral:   11.10 cm/s LVOT diam:     2.40 cm  LV E/e' lateral: 5.5 LV SV:         79       LV e' medial:    6.64 cm/s LV SV Index:   27       LV E/e' medial:  9.3 LVOT Area:     4.52 cm  RIGHT VENTRICLE             IVC RV Basal diam:  3.86 cm     IVC diam: 2.54 cm RV Mid diam:    2.47 cm RV S prime:     12.10 cm/s TAPSE (M-mode): 2.1 cm LEFT ATRIUM             Index       RIGHT ATRIUM           Index LA diam:        4.10 cm 1.42 cm/m  RA Area:     25.00 cm LA Vol (A2C):   69.5 ml 24.02 ml/m RA Volume:   76.70 ml  26.51 ml/m  LA Vol (A4C):   44.1 ml 15.24 ml/m LA Biplane Vol: 56.5 ml 19.53 ml/m  AORTIC VALVE LVOT Vmax:   85.00 cm/s LVOT Vmean:  54.500 cm/s  LVOT VTI:    0.175 m  AORTA Ao Root diam: 4.00 cm Ao Asc diam:  3.40 cm MITRAL VALVE MV Area (PHT): 2.73 cm    SHUNTS MV Decel Time: 278 msec    Systemic VTI:  0.18 m MV E velocity: 61.60 cm/s  Systemic Diam: 2.40 cm MV A velocity: 67.40 cm/s MV E/A ratio:  0.91 Weston BrassGayatri Acharya MD Electronically signed by Weston BrassGayatri Acharya MD Signature Date/Time: 10/30/2019/4:25:55 PM    Final     Subjective: No acute issues or events overnight denies nausea, vomiting, diarrhea, constipation, headache, fever, chills, chest pain, shortness of breath notations.  Discharge Exam: Vitals:   11/13/19 0725 11/13/19 0730  BP: 131/81 126/87  Pulse:  67  Resp:    Temp:    SpO2:  97%   Vitals:   11/12/19 2123 11/13/19 0429 11/13/19 0725 11/13/19 0730  BP: 124/86 108/78 131/81 126/87  Pulse: 65   67  Resp: 20 16    Temp: 98 F (36.7 C) 98.5 F (36.9 C)    TempSrc: Oral Oral    SpO2:    97%  Weight:  (!) 170 kg    Height:        General: Pt is alert, awake, not in acute distress Cardiovascular: RRR, S1/S2 +, no rubs, no gallops Respiratory: CTA bilaterally, no wheezing, no rhonchi Abdominal: Soft, NT, ND, bowel sounds + Extremities: no edema, no cyanosis    The results of significant diagnostics from this hospitalization (including imaging, microbiology, ancillary and laboratory) are listed below for reference.     Microbiology: Recent Results (from the past 240 hour(s))  Respiratory Panel by RT PCR (Flu A&B, Covid) - Nasopharyngeal Swab     Status: None   Collection Time: 11/12/19 12:22 AM   Specimen: Nasopharyngeal Swab  Result Value Ref Range Status   SARS Coronavirus 2 by RT PCR NEGATIVE NEGATIVE Final    Comment: (NOTE) SARS-CoV-2 target nucleic acids are NOT DETECTED. The SARS-CoV-2 RNA is generally detectable in upper respiratoy specimens during the acute phase of infection. The lowest concentration of SARS-CoV-2 viral copies this assay can detect is 131 copies/mL. A negative result does  not preclude SARS-Cov-2 infection and should not be used as the sole basis for treatment or other patient management decisions. A negative result may occur with  improper specimen collection/handling, submission of specimen other than nasopharyngeal swab, presence of viral mutation(s) within the areas targeted by this assay, and inadequate number of viral copies (<131 copies/mL). A negative result must be combined with clinical observations, patient history, and epidemiological information. The expected result is Negative. Fact Sheet for Patients:  https://www.moore.com/https://www.fda.gov/media/142436/download Fact Sheet for Healthcare Providers:  https://www.young.biz/https://www.fda.gov/media/142435/download This test is not yet ap proved or cleared by the [place] FDA and  has been authorized for detection and/or diagnosis of SARS-CoV-2 by FDA under an Emergency Use Authorization (EUA). This EUA will remain  in effect (meaning this test can be used) for the duration of the COVID-19 declaration under Section 564(b)(1) of the Act, 21 U.S.C. section 360bbb-3(b)(1), unless the authorization is terminated or revoked sooner.    Influenza A by PCR NEGATIVE NEGATIVE Final   Influenza B by PCR NEGATIVE NEGATIVE Final    Comment: (NOTE) The Xpert Xpress SARS-CoV-2/FLU/RSV assay is intended as an aid in  the diagnosis  of influenza from Nasopharyngeal swab specimens and  should not be used as a sole basis for treatment. Nasal washings and  aspirates are unacceptable for Xpert Xpress SARS-CoV-2/FLU/RSV  testing. Fact Sheet for Patients: https://www.moore.com/ Fact Sheet for Healthcare Providers: https://www.young.biz/ This test is not yet approved or cleared by the [place] FDA and  has been authorized for detection and/or diagnosis of SARS-CoV-2 by  FDA under an Emergency Use Authorization (EUA). This EUA will remain  in effect (meaning this test can be used) for the duration of the   Covid-19 declaration under Section 564(b)(1) of the Act, 21  U.S.C. section 360bbb-3(b)(1), unless the authorization is  terminated or revoked. Performed at [facility], [address] [place], [place] [address]      Labs: BNP (last 3 results) Recent Labs    10/30/19 0015  BNP 14.9   Basic Metabolic Panel: Recent Labs  Lab 11/11/19 2057 11/12/19 0321 11/13/19 0504  NA 138 140 136  K 3.3* 4.0 4.2  CL 102 108 105  CO2 20* 25 22  GLUCOSE 131* 105* 105*  BUN CREATININE 1.17 1.16 0.96  CALCIUM 9.0 9.0 9.0  MG 2.0 2.0  --    Liver Function Tests: Recent Labs  Lab 11/13/19 0504  AST 26  ALT 30  ALKPHOS 44  BILITOT 0.8  PROT 5.8*  ALBUMIN 3.3*   No results for input(s): LIPASE, AMYLASE in the last 168 hours. No results for input(s): AMMONIA in the last 168 hours. CBC: Recent Labs  Lab 11/11/19 2057 11/12/19 0321 11/13/19 0504  WBC 7.3 6.4 5.9  NEUTROABS 4.6  --   --   HGB 13.8 13.1 13.0  HCT 40.2 38.7* 38.4*  MCV 88.9 89.8 90.8  PLT 268 249 226   Cardiac Enzymes: No results for input(s): CKTOTAL, CKMB, CKMBINDEX, TROPONINI in the last 168 hours. BNP: Invalid input(s): POCBNP CBG: No results for input(s): GLUCAP in the last 168 hours. D-Dimer Recent Labs    11/11/19 2057  DDIMER 0.30   Hgb A1c No results for input(s): HGBA1C in the last 72 hours. Lipid Profile No results for input(s): CHOL, HDL, LDLCALC, TRIG, CHOLHDL, LDLDIRECT in the last 72 hours. Thyroid function studies No results for input(s): TSH, T4TOTAL, T3FREE, THYROIDAB in the last 72 hours.  Invalid input(s): FREET3 Anemia work up No results for input(s): VITAMINB12, FOLATE, FERRITIN, TIBC, IRON, RETICCTPCT in the last 72 hours. Urinalysis No results found for: COLORURINE, APPEARANCEUR, LABSPEC, PHURINE, GLUCOSEU, HGBUR, BILIRUBINUR, KETONESUR, PROTEINUR, UROBILINOGEN, NITRITE, LEUKOCYTESUR Sepsis Labs Invalid input(s): PROCALCITONIN,  WBC,   LACTICIDVEN Microbiology Recent Results (from the past 240 hour(s))  Respiratory Panel by RT PCR (Flu A&B, Covid) - Nasopharyngeal Swab     Status: None   Collection Time: 11/12/19 12:22 AM   Specimen: Nasopharyngeal Swab  Result Value Ref Range Status   SARS Coronavirus 2 by RT PCR NEGATIVE NEGATIVE Final    Comment: (NOTE) SARS-CoV-2 target nucleic acids are NOT DETECTED. The SARS-CoV-2 RNA is generally detectable in upper respiratoy specimens during the acute phase of infection. The lowest concentration of SARS-CoV-2 viral copies this assay can detect is 131 copies/mL. A negative result does not preclude SARS-Cov-2 infection and should not be used as the sole basis for treatment or other patient management decisions. A negative result may occur with  improper specimen collection/handling, submission of specimen other than nasopharyngeal swab, presence of viral mutation(s) within the areas targeted by this assay, and inadequate number of  viral copies (<131 copies/mL). A negative result must be combined with clinical observations, patient history, and epidemiological information. The expected result is Negative. Fact Sheet for Patients:  https://www.moore.com/ Fact Sheet for Healthcare Providers:  https://www.young.biz/ This test is not yet ap proved or cleared by the [place] FDA and  has been authorized for detection and/or diagnosis of SARS-CoV-2 by FDA under an Emergency Use Authorization (EUA). This EUA will remain  in effect (meaning this test can be used) for the duration of the COVID-19 declaration under Section 564(b)(1) of the Act, 21 U.S.C. section 360bbb-3(b)(1), unless the authorization is terminated or revoked sooner.    Influenza A by PCR NEGATIVE NEGATIVE Final   Influenza B by PCR NEGATIVE NEGATIVE Final    Comment: (NOTE) The Xpert Xpress SARS-CoV-2/FLU/RSV assay is intended as an aid in  the diagnosis of influenza  from Nasopharyngeal swab specimens and  should not be used as a sole basis for treatment. Nasal washings and  aspirates are unacceptable for Xpert Xpress SARS-CoV-2/FLU/RSV  testing. Fact Sheet for Patients: https://www.moore.com/ Fact Sheet for Healthcare Providers: https://www.young.biz/ This test is not yet approved or cleared by the [place] FDA and  has been authorized for detection and/or diagnosis of SARS-CoV-2 by  FDA under an Emergency Use Authorization (EUA). This EUA will remain  in effect (meaning this test can be used) for the duration of the  Covid-19 declaration under Section 564(b)(1) of the Act, 21  U.S.C. section 360bbb-3(b)(1), unless the authorization is  terminated or revoked. Performed at [facility], [address] [place], [place] [address]      Time coordinating discharge: Over 30 minutes  SIGNED:   Azucena Fallen, DO [facility] 11/13/2019, 11:00 AM Pager   If 7PM-7AM, please contact night-coverage [email or web address]

## 2019-11-13 NOTE — Progress Notes (Signed)
   11/13/19 0725 11/13/19 0730  Vitals  BP 131/81 126/87  MAP (mmHg) 94 98  BP Location Right Arm Right Arm  BP Method Automatic Automatic  Patient Position (if appropriate) Sitting  --   Pulse Rate  --  67  ECG Heart Rate 84 69   Patient ambulated 470 ft in hall with steady gait. Denied any shortness of breath, chest pain, feeling dizzy or lightheaded. No complaints of feeling weak. Vital signs above before and after activity. Patient remains on room air with O2sats in the 90's.

## 2019-11-22 ENCOUNTER — Other Ambulatory Visit: Payer: Self-pay | Admitting: Physician Assistant

## 2019-12-15 ENCOUNTER — Other Ambulatory Visit: Payer: Self-pay

## 2019-12-15 ENCOUNTER — Emergency Department (HOSPITAL_COMMUNITY)
Admission: EM | Admit: 2019-12-15 | Discharge: 2019-12-16 | Disposition: A | Discharge status: Home / Self Care | Payer: Managed Care, Other (non HMO) | Attending: Emergency Medicine | Admitting: Emergency Medicine

## 2019-12-15 ENCOUNTER — Emergency Department (HOSPITAL_COMMUNITY): Payer: Managed Care, Other (non HMO)

## 2019-12-15 ENCOUNTER — Encounter (HOSPITAL_COMMUNITY): Payer: Self-pay | Admitting: Emergency Medicine

## 2019-12-15 DIAGNOSIS — R600 Localized edema: Secondary | ICD-10-CM | POA: Insufficient documentation

## 2019-12-15 DIAGNOSIS — F17210 Nicotine dependence, cigarettes, uncomplicated: Secondary | ICD-10-CM | POA: Diagnosis not present

## 2019-12-15 DIAGNOSIS — Z7982 Long term (current) use of aspirin: Secondary | ICD-10-CM | POA: Diagnosis not present

## 2019-12-15 DIAGNOSIS — I2510 Atherosclerotic heart disease of native coronary artery without angina pectoris: Secondary | ICD-10-CM | POA: Insufficient documentation

## 2019-12-15 DIAGNOSIS — I10 Essential (primary) hypertension: Secondary | ICD-10-CM | POA: Insufficient documentation

## 2019-12-15 DIAGNOSIS — R079 Chest pain, unspecified: Secondary | ICD-10-CM | POA: Insufficient documentation

## 2019-12-15 LAB — CBC
HCT: 40.1 % (ref 39.0–52.0)
Hemoglobin: 13.5 g/dL (ref 13.0–17.0)
MCH: 30.5 pg (ref 26.0–34.0)
MCHC: 33.7 g/dL (ref 30.0–36.0)
MCV: 90.7 fL (ref 80.0–100.0)
Platelets: 169 10*3/uL (ref 150–400)
RBC: 4.42 MIL/uL (ref 4.22–5.81)
RDW: 12.5 % (ref 11.5–15.5)
WBC: 6.5 10*3/uL (ref 4.0–10.5)
nRBC: 0 % (ref 0.0–0.2)

## 2019-12-15 LAB — PROTIME-INR
INR: 1.2 (ref 0.8–1.2)
Prothrombin Time: 14.4 seconds (ref 11.4–15.2)

## 2019-12-15 MED ORDER — SODIUM CHLORIDE 0.9% FLUSH: 3.0000 mL | Freq: Once | INTRAVENOUS | Status: DC

## 2019-12-15 NOTE — ED Triage Notes (Signed)
Patient reports left chest discomfort / palpitations with mild SOB this evening , denies emesis or diaphoresis , patient took 2 NTG sl prior to arrival , patient added elevated blood pressure 165/102 this evening .

## 2019-12-16 ENCOUNTER — Encounter (HOSPITAL_COMMUNITY): Payer: Self-pay | Admitting: Student

## 2019-12-16 LAB — BASIC METABOLIC PANEL
Anion gap: 10 (ref 5–15)
BUN: 15 mg/dL (ref 6–20)
CO2: 24 mmol/L (ref 22–32)
Calcium: 9 mg/dL (ref 8.9–10.3)
Chloride: 104 mmol/L (ref 98–111)
Creatinine, Ser: 1.11 mg/dL (ref 0.61–1.24)
GFR calc Af Amer: >60 mL/min (ref >60)
GFR calc non Af Amer: >60 mL/min (ref >60)
Glucose, Bld: 100 mg/dL — ABNORMAL HIGH (ref 70–99)
Potassium: 3.6 mmol/L (ref 3.5–5.1)
Sodium: 138 mmol/L (ref 135–145)

## 2019-12-16 LAB — TROPONIN I (HIGH SENSITIVITY)
Troponin I (High Sensitivity): 7 ng/L (ref <18)
Troponin I (High Sensitivity): 9 ng/L (ref <18)

## 2019-12-16 MED ORDER — FUROSEMIDE 20 MG PO TABS
20.0000 mg | ORAL_TABLET | Freq: Every day | ORAL | 0 refills | Status: DC
Start: 2019-12-16 — End: 2020-10-03

## 2019-12-16 MED ORDER — POTASSIUM CHLORIDE CRYS ER 20 MEQ PO TBCR: 20.0000 meq | EXTENDED_RELEASE_TABLET | Freq: Every day | ORAL | 0 refills | Status: DC

## 2019-12-16 NOTE — Discharge Instructions (Addendum)
You were seen in the emergency department today for chest pain. Your work-up in the emergency department has been overall reassuring. Your labs have been fairly normal and or similar to previous blood work you have had done. Your EKG and the enzyme we use to check your heart did not show an acute heart attack at this time. Your chest x-ray was normal.   We are starting you on a short course of Lasix, a fluid pill, to take to help with your lower extremity swelling.  Also sending you with potassium supplement as Lasix can lower your potassium.  Please take these medications as prescribed.  We have prescribed you new medication(s) today. Discuss the medications prescribed today with your pharmacist as they can have adverse effects and interactions with your other medicines including over the counter and prescribed medications. Seek medical evaluation if you start to experience new or abnormal symptoms after taking one of these medicines, seek care immediately if you start to experience difficulty breathing, feeling of your throat closing, facial swelling, or rash as these could be indications of a more serious allergic reaction  Continue to monitor your weights at home.  We would like you to follow up closely with your primary care provider and/or the cardiologist provided in your discharge instructions within 1-3 days. Return to the ER immediately should you experience any new or worsening symptoms including but not limited to return of pain, worsened pain, vomiting, shortness of breath, dizziness, lightheadedness, passing out, increased swelling or any other concerns that you may have.

## 2019-12-16 NOTE — ED Notes (Signed)
Pt verbalized understanding of d/c instructions, follow up care and s.s requiring return to ed. Pt had no additional questions and was transported via wheelchair to exit.  

## 2019-12-16 NOTE — ED Provider Notes (Signed)
[facility] EMERGENCY DEPARTMENT Provider Note   CSN: [id number] Arrival date & time: 12/15/19  2253     History Chief Complaint  Patient presents with  . Chest Pain    Edward Obrien is a 49 y.o. male with a hx of CAD (most recent cath 10/30/19 mild to moderate disease, no intervention), hypertension, hyperlipidemia, OSA, obesity, & SVT who presents to the ED with complaints of chest discomfort that began around 21:30 last night (06/11). Patient states that he has been monitoring his blood pressure and weight very closely at home due to some swelling in his bilateral lower extremities for the past 2 weeks. He states today the swelling seemed a bit worse and his BP was elevated 151/100 which concerned him, when he checked his blood pressure tonight he started to have some chest discomfort, difficult to describe, lasted < 20 minutes, not exertional or pleuritic, associated with palpitations, no dyspnea, nausea, vomiting, diaphoresis, dizziness, or syncope. Denies hemoptysis, recent surgery/trauma, recent long travel, hormone use, personal hx of cancer, or hx of DVT/PE. He has seen the APP at his work who planned to get basic labs and start a PRN diuretic, however she went on vacation prior to labs returning, he saw his cardiologist within the past 1 week who he states said to follow up with PCP for this. Did have recent CT coronary with mild to moderate disease. Patient is chest pain free at this time.   HPI     Past Medical History:  Diagnosis Date  . CAD (coronary artery disease) 10/30/2019   mild-mod by cardiac CT>>med rx  . HTN (hypertension)   . Hyperlipidemia LDL goal <70   . OSA on CPAP 11/2019  . SVT (supraventricular tachycardia) [facility])     Patient Active Problem List   Diagnosis Date Noted  . Hypotension 11/12/2019  . Chest pain 10/30/2019  . Exertional dyspnea 10/30/2019  . Hyperglycemia 10/30/2019  . OSA (obstructive sleep apnea)   . Morbid obesity  ([place])   . SVT (supraventricular tachycardia) ([facility])     Past Surgical History:  Procedure Laterality Date  . CARDIAC CATHETERIZATION  10/30/2019   mild-mod dz>>med rx  . VASECTOMY         Family History  Problem Relation Age of Onset  . Alzheimer's disease Mother   . Hypertension Mother   . Diabetes Mother   . Failure to thrive Father     Social History   Tobacco Use  . Smoking status: Current Every Day Smoker    Types: Cigars  . Smokeless tobacco: Never Used  Substance Use Topics  . Alcohol use: Never  . Drug use: Never    Home Medications Prior to Admission medications   Medication Sig Start Date End Date Taking? Authorizing Provider  aspirin EC 81 MG tablet Take 1 tablet (81 mg total) by mouth daily. 10/31/19 10/30/20  Arrien, Jimmy Picket, MD  aspirin-acetaminophen-caffeine (EXCEDRIN MIGRAINE) [phone] MG tablet Take 1 tablet by mouth every 6 (six) hours as needed for headache.    [provider]  diclofenac (VOLTAREN) 75 MG EC tablet Take 75 mg by mouth 2 (two) times daily as needed for pain. 03/15/19 03/14/20  [provider]  Docusate Sodium (STOOL SOFTENER) 100 MG capsule Take 100 mg by mouth daily.    [provider]  metoprolol tartrate (LOPRESSOR) 25 MG tablet Take 0.5 tablets (12.5 mg total) by mouth 2 (two) times daily. 11/13/19   Little Ishikawa, MD  Multiple  Vitamin (MULTIVITAMIN) capsule Take 1 capsule by mouth daily.    [provider]  nitroGLYCERIN (NITROSTAT) 0.4 MG SL tablet Place 1 tablet (0.4 mg total) under the tongue every 5 (five) minutes as needed for chest pain. 10/31/19   Arrien, York Ram, MD  Omega 3 1200 MG CAPS Take 2 capsules by mouth daily.    [provider]  pantoprazole (PROTONIX) 40 MG tablet Take 1 tablet (40 mg total) by mouth daily. 11/22/19   Barrett, Joline Salt, PA-C  Probiotic Product (PROBIOTIC PO) Take 1 tablet by mouth daily.    [provider]    Allergies      Patient has no known allergies.  Review of Systems   Review of Systems  Constitutional: Negative for chills, diaphoresis and fever.  Respiratory: Negative for cough and shortness of breath.   Cardiovascular: Positive for chest pain, palpitations and leg swelling.  Gastrointestinal: Negative for abdominal pain, nausea and vomiting.  Neurological: Negative for syncope.  All other systems reviewed and are negative.   Physical Exam Updated Vital Signs BP (!) 130/94 (BP Location: Right Arm)   Pulse 60   Temp 97.7 F (36.5 C) (Oral)   Resp 12   Ht 6\' 4"  (1.93 m)   Wt (!) 170 kg   SpO2 100%   BMI 45.62 kg/m   Physical Exam Vitals and nursing note reviewed.  Constitutional:      General: He is not in acute distress.    Appearance: He is well-developed. He is not toxic-appearing.  HENT:     Head: Normocephalic and atraumatic.  Eyes:     General:        Right eye: No discharge.        Left eye: No discharge.     Conjunctiva/sclera: Conjunctivae normal.  Cardiovascular:     Rate and Rhythm: Regular rhythm. Bradycardia present.     Pulses:          Radial pulses are 2+ on the right side and 2+ on the left side.  Pulmonary:     Effort: Pulmonary effort is normal. No respiratory distress.     Breath sounds: Normal breath sounds. No wheezing, rhonchi or rales.  Abdominal:     General: There is no distension.     Palpations: Abdomen is soft.     Tenderness: There is no abdominal tenderness.  Musculoskeletal:     Cervical back: Neck supple.     Comments: 2+ symmetric pitting edema to the bilateral lower legs without overlying erythema or warmth.  No calf tenderness.  Skin:    General: Skin is warm and dry.     Findings: No rash.  Neurological:     Mental Status: He is alert.     Comments: Clear speech.   Psychiatric:        Behavior: Behavior normal.     ED Results / Procedures / Treatments   Labs (all labs ordered are listed, but only abnormal results are  displayed) Labs Reviewed  BASIC METABOLIC PANEL - Abnormal; Notable for the following components:      Result Value   Glucose, Bld 100 (*)    All other components within normal limits  CBC  PROTIME-INR  TROPONIN I (HIGH SENSITIVITY)  TROPONIN I (HIGH SENSITIVITY)    EKG EKG Interpretation  Date/Time:  Friday December 15 2019 22:57:44 EDT Ventricular Rate:  62 PR Interval:  198 QRS Duration: 150 QT Interval:  460 QTC Calculation: 466 R Axis:  116 Text Interpretation: Normal sinus rhythm Right bundle branch block Abnormal ECG No significant change since last tracing Confirmed by Ward, Baxter Hire [phone]) on 12/16/2019 3:53:46 AM   Radiology DG Chest 2 View  Result Date: 12/15/2019 CLINICAL DATA:  Chest pain EXAM: CHEST - 2 VIEW COMPARISON:  11/11/2019 FINDINGS: Heart and mediastinal contours are within normal limits. No focal opacities or effusions. No acute bony abnormality. IMPRESSION: No active cardiopulmonary disease. Electronically Signed   By: Charlett Nose M.D.   On: 12/15/2019 23:27    Procedures Procedures (including critical care time)  Medications Ordered in ED Medications  sodium chloride flush (NS) 0.9 % injection 3 mL (3 mLs Intravenous Not Given 12/16/19 0331)    ED Course  I have reviewed the triage vital signs and the nursing notes.  Pertinent labs & imaging results that were available during my care of the patient were reviewed by me and considered in my medical decision making (see chart for details).    MDM Rules/Calculators/A&P                         Patient presents to the ED with complaints of chest discomfort this evening, also mentions issues with lower extremity swelling and elevated blood pressures over the past 2 weeks.  He is nontoxic, resting comfortably, he is intermittently mildly bradycardic with mildly elevated blood pressure, low suspicion for hypertensive emergency.  He has a relatively benign exam with the exception that he does have 2+  symmetric pitting edema to the lower legs without signs of infection or calf tenderness.  Additional history obtained:  Additional history obtained from patient's wife at bedside.. Previous records obtained and reviewed: 10/2019: CT coronary with mild to moderate disease and echocardiogram with EF of 55 to 60%, grade 1 diastolic dysfunction noted.  EKG: No significant change from prior Lab Tests:  I reviewed and interpreted labs, which included:  CBC: No anemia or leukocytosis BMP: No significant electrolyte derangement, renal function preserved PT/INR within normal limits  Imaging Studies ordered:  I ordered imaging studies which included chest x-ray, I independently visualized and interpreted imaging which showed no acute active disease.  ED Course:  Hear score 4 patient's chest pain seems fairly atypical, nonexertional, troponins are flat, EKG without significant change from prior, recent CT coronaries with mild to moderate disease, low suspicion for ACS.  Low risk Wells, low suspicion for PE.  No widened mediastinum on chest x-ray, symmetric pulses, low suspicion for dissection.  Labs reassuring.  Blood pressure not significantly elevated in the ED, only mild elevation.  He is mildly bradycardic, currently on metoprolol.  He does have bilateral lower extremity pitting edema, no signs of CHF/pulmonary edema on his chest x-ray, no signs of respiratory distress, this is symmetric therefore do not suspect DVT.  Will give short course of Lasix with potassium supplementation with close follow-up with his cardiologist and primary care provider. I discussed results, treatment plan, need for follow-up, and return precautions with the patient. Provided opportunity for questions, patient confirmed understanding and is in agreement with plan.   Findings and plan of care discussed with supervising physician Dr. Elesa Massed who is in agreement.   Portions of this note were generated with [employer]. Dictation errors may occur despite best attempts at proofreading.   Final Clinical Impression(s) / ED Diagnoses Final diagnoses:  Chest pain, unspecified type  Lower extremity edema    Rx / DC Orders ED Discharge Orders  Ordered    furosemide (LASIX) 20 MG tablet  Daily     Discontinue  Reprint     12/16/19 0407    potassium chloride SA (KLOR-CON) 20 MEQ tablet  Daily     Discontinue  Reprint     12/16/19 0407           Tayona Sarnowski, Pleas Koch, PA-C 12/16/19 0409    Ward, Layla Maw, DO 12/16/19 [phone]

## 2020-01-02 ENCOUNTER — Telehealth: Payer: Self-pay

## 2020-01-02 NOTE — Telephone Encounter (Signed)
Referral on file Edward Obrien [phone], SENT REFERRAL TO SCHEDULING

## 2020-01-23 ENCOUNTER — Other Ambulatory Visit: Payer: Self-pay

## 2020-01-23 ENCOUNTER — Ambulatory Visit: Payer: Managed Care, Other (non HMO) | Admitting: Cardiovascular Disease

## 2020-01-23 ENCOUNTER — Encounter: Payer: Self-pay | Admitting: Cardiovascular Disease

## 2020-01-23 VITALS — BP 118/73 | HR 62 | Ht 76.0 in | Wt 331.2 lb

## 2020-01-23 DIAGNOSIS — I2510 Atherosclerotic heart disease of native coronary artery without angina pectoris: Secondary | ICD-10-CM | POA: Diagnosis not present

## 2020-01-23 DIAGNOSIS — I471 Supraventricular tachycardia: Secondary | ICD-10-CM

## 2020-01-23 DIAGNOSIS — E785 Hyperlipidemia, unspecified: Secondary | ICD-10-CM | POA: Diagnosis not present

## 2020-01-23 DIAGNOSIS — R7303 Prediabetes: Secondary | ICD-10-CM

## 2020-01-23 DIAGNOSIS — G4733 Obstructive sleep apnea (adult) (pediatric): Secondary | ICD-10-CM

## 2020-01-23 DIAGNOSIS — I10 Essential (primary) hypertension: Secondary | ICD-10-CM

## 2020-01-23 DIAGNOSIS — R600 Localized edema: Secondary | ICD-10-CM

## 2020-01-23 NOTE — Patient Instructions (Signed)

## 2020-01-23 NOTE — Progress Notes (Signed)
Cardiology Office Note:    Date:  01/24/2020   ID:  Edward Obrien, DOB 10-15-1970, MRN [id number]  PCP:  Eartha Inch, MD  [facility] Cardiologist:  Marcheta Horsey [facility] Electrophysiologist:  None   Referring MD: Eartha Inch, MD   Chief Complaint  Patient presents with  . Coronary Artery Disease    Markedly elevated calcium score without coronary stenoses  . Edema    History of Present Illness:    Edward Obrien is a 49 y.o. male with a hx of severely elevated calcium score but no significant coronary stenosis by recent coronary CT angiography, essential hypertension, obstructive sleep apnea, dyslipidemia (very low HDL), obesity.  He was recently hospitalized at [facility] with chest pain.  The coronary CT angiogram did not show any significant obstruction, but suggested that he does have gastroesophageal reflux since there was substantial fluid in his thoracic esophagus.  He also had symptoms strongly suggestive of obstructive sleep apnea.  He has made some remarkable changes in his lifestyle.  He started using CPAP and is 100% compliant with it.  He has noticed that he has markedly improved quality of sleep, wakes up feeling energetic and can put in a whole day's work without ever feeling tired or needing a nap.  His apnea/hypopnea index has gone from 31 to 1.  Even more impressive is his commitment to healthy diet and exercise.  He walks 5 miles every morning 5 days a week at a pace of 15-18-minute miles.  He walks on the path since [place] which can be quite hilly.  He has noticed a peak heart rate of 240 bpm on his smart watch.  A typical day will include 20,000 steps.  He is eating and almost vegan diet.  He is careful to include lots of vitamins and oligo elements and eats a lot of fresh fruits and vegetables.  He keeps dietary log to make sure that he is not missing out on important nutrients.  He has lost 50 pounds in 2 months.  He feels much  better.  He has also changed his work schedule refusing to be on call every night as in the past.  His employer has been understanding.  His stress at work has diminished.  He works in Consulting civil engineer.  Early on after he started changing his diet he would have intermittent problems with fluid gain and lower extremity edema.  One of the leads episodes was associated with a 50 pound weight gain in 3 days even though he was watching his diet.  He went to the emergency room and improved quickly after being administered diuretics.  Since then he has been taking an intermittent dose of furosemide 20 mg on the average about once a week.  He has not had recurrent problems with serious edema.  Recently check creatinine was 1.1 on June 11.  Potassium was 3.6.  His lipid profile during his hospitalization in late April included a total cholesterol 167, HDL 28, LDL 102 and triglycerides 409 as well as a hemoglobin A1c of 6%.  On a separate assay day earlier his triglycerides were only 81 but the other values were unchanged.  A previous 48-hour Holter monitor showed an 8 beat run of SVT, but no serious bradycardia or pauses or VT.  His echocardiogram showed a normal left ventricular size and function, EF 55-60%, "grade 1 diastolic dysfunction", mildly enlarged right ventricle with normal systolic function, dilated aortic root at 4.0 cm (uncertain whether this  is truly abnormal for a 6 foot 4, 350 pound gentleman).  Past Medical History:  Diagnosis Date  . CAD (coronary artery disease) 10/30/2019   mild-mod by cardiac CT>>med rx  . HTN (hypertension)   . Hyperlipidemia LDL goal <70   . OSA on CPAP 11/2019  . SVT (supraventricular tachycardia) (HCC)     Past Surgical History:  Procedure Laterality Date  . CARDIAC CATHETERIZATION  10/30/2019   mild-mod dz>>med rx  . VASECTOMY      Current Medications: Current Meds  Medication Sig  . aspirin EC 81 MG tablet Take 1 tablet (81 mg total) by mouth daily.  Marland Kitchen  aspirin-acetaminophen-caffeine (EXCEDRIN MIGRAINE) 250-250-65 MG tablet Take 1 tablet by mouth every 6 (six) hours as needed for headache.  . diclofenac (VOLTAREN) 75 MG EC tablet Take 75 mg by mouth 2 (two) times daily as needed for pain.  . furosemide (LASIX) 20 MG tablet Take 1 tablet (20 mg total) by mouth daily.  . metoprolol tartrate (LOPRESSOR) 25 MG tablet Take 0.5 tablets (12.5 mg total) by mouth 2 (two) times daily.  . Multiple Vitamin (MULTIVITAMIN) capsule Take 1 capsule by mouth daily.  . nitroGLYCERIN (NITROSTAT) 0.4 MG SL tablet Place 1 tablet (0.4 mg total) under the tongue every 5 (five) minutes as needed for chest pain.  . Omega 3 1200 MG CAPS Take 2 capsules by mouth daily.  . pantoprazole (PROTONIX) 40 MG tablet Take 1 tablet (40 mg total) by mouth daily.  . potassium chloride SA (KLOR-CON) 20 MEQ tablet Take 1 tablet (20 mEq total) by mouth daily.  . Probiotic Product (PROBIOTIC PO) Take 1 tablet by mouth daily.     Allergies:   Patient has no known allergies.   Social History   Socioeconomic History  . Marital status: Married    Spouse name: Not on file  . Number of children: Not on file  . Years of education: Not on file  . Highest education level: Not on file  Occupational History  . Not on file  Tobacco Use  . Smoking status: Current Every Day Smoker    Types: Cigars  . Smokeless tobacco: Never Used  Substance and Sexual Activity  . Alcohol use: Never  . Drug use: Never  . Sexual activity: Yes  Other Topics Concern  . Not on file  Social History Narrative  . Not on file   Social Determinants of Health   Financial Resource Strain:   . Difficulty of Paying Living Expenses:   Food Insecurity:   . Worried About Programme researcher, broadcasting/film/video in the Last Year:   . Barista in the Last Year:   Transportation Needs:   . Freight forwarder (Medical):   Marland Kitchen Lack of Transportation (Non-Medical):   Physical Activity:   . Days of Exercise per Week:   .  Minutes of Exercise per Session:   Stress:   . Feeling of Stress :   Social Connections:   . Frequency of Communication with Friends and Family:   . Frequency of Social Gatherings with Friends and Family:   . Attends Religious Services:   . Active Member of Clubs or Organizations:   . Attends Banker Meetings:   Marland Kitchen Marital Status:      Family History: The patient's family history includes Alzheimer's disease in his mother; Diabetes in his mother; Failure to thrive in his father; Hypertension in his mother.  ROS:   Please see the history of present  illness.     All other systems reviewed and are negative.  EKGs/Labs/Other Studies Reviewed:    The following studies were reviewed today: ED visit notes and labs  EKG:  EKG is not ordered today.    Recent Labs: 10/30/2019: B Natriuretic Peptide 14.9 11/12/2019: Magnesium 2.0 11/13/2019: ALT 30 12/15/2019: BUN 15; Creatinine, Ser 1.11; Hemoglobin 13.5; Platelets 169; Potassium 3.6; Sodium 138  Recent Lipid Panel    Component Value Date/Time   CHOL 167 10/31/2019 0246   TRIG 184 (H) 10/31/2019 0246   HDL 28 (L) 10/31/2019 0246   CHOLHDL 6.0 10/31/2019 0246   VLDL 37 10/31/2019 0246   LDLCALC 102 (H) 10/31/2019 0246    Physical Exam:    VS:  BP 118/73   Pulse 62   Ht 6\' 4"  (1.93 m)   Wt (!) 331 lb 3.2 oz (150.2 kg)   SpO2 99%   BMI 40.31 kg/m     Wt Readings from Last 3 Encounters:  01/23/20 (!) 331 lb 3.2 oz (150.2 kg)  12/15/19 (!) 374 lb 12.5 oz (170 kg)  11/13/19 (!) 374 lb 11.2 oz (170 kg)     GEN: Morbidly obese, but appears fit; well nourished, well developed in no acute distress HEENT: Normal NECK: No JVD; No carotid bruits LYMPHATICS: No lymphadenopathy CARDIAC: RRR, no murmurs, rubs, gallops RESPIRATORY:  Clear to auscultation without rales, wheezing or rhonchi  ABDOMEN: Soft, non-tender, non-distended MUSCULOSKELETAL:  No edema; No deformity  SKIN: Warm and dry NEUROLOGIC:  Alert and  oriented x 3 PSYCHIATRIC:  Normal affect   ASSESSMENT:    1. Coronary artery disease involving native coronary artery of native heart without angina pectoris   2. Dyslipidemia   3. Prediabetes   4. OSA (obstructive sleep apnea)   5. Edema of both lower extremities   6. Morbid obesity (HCC)   7. SVT (supraventricular tachycardia) (HCC)   8. Essential hypertension    PLAN:    In order of problems listed above:  1. CAD: He is now performing regular and fairly intense physical activity without any chest discomfort.  She did not have significant coronary stenoses on the coronary CTA.  His previous episode of chest pain was likely due to acid reflux.  On the other hand he does have a high burden of coronary calcium and aggressive risk factor modification is indicated.  Taking aspirin and low-dose beta-blocker. 2. HLP: His major lipid adverse factor is low HDL for which we do not have good pharmacological therapy.  He is putting in a lot of work to improve his diet did not increase exercise and this will definitely help.  He has done a remarkable job losing weight.  He prefers not to take statins.  We discussed the strong body of prove behind these medications for prevention of coronary events, but I think it is quite reasonable to wait and see what his follow-up lipid profile will be following his multiple lifestyle changes.  He will have follow-up labs with Dr. 01/13/20.  Target LDL less than 70.   3. PreDM; hemoglobin A1c was 6%, but I am sure at follow-up it will be greatly improved 4. OSA: Compliant with CPAP and excellent clinical benefit with resolution of fatigue and hypersomnolence. 5. Edema: I suspect this is an expression of right heart failure related to his obstructive sleep apnea.  I also suspect that his requirements for diuretics will lessen gradually over time with reduced weight and improved sleep physiology.  Okay to use  intermittent doses of furosemide.  He is eating a very high  potassium diet and does not need supplements. 6. Morbid obesity: His goal is to get back down to 240 pounds which will make him only overweight, no longer obese.  This is very ambitious, but he does appear determined and very resourceful. 7. SVT: Brief nonsustained event incidentally noted on arrhythmia monitor.  Unlikely to be clinically significant.  May be related to untreated sleep apnea.  On low-dose beta-blocker. 8. HTN: It appears that he is already "cured" this disorder with his high potassium low-sodium diet and increase physical activity and weight loss.   Medication Adjustments/Labs and Tests Ordered: Current medicines are reviewed at length with the patient today.  Concerns regarding medicines are outlined above.  No orders of the defined types were placed in this encounter.  No orders of the defined types were placed in this encounter.   Patient Instructions  Medication Instructions:  No changes *If you need a refill on your cardiac medications before your next appointment, please call your pharmacy*   Lab Work: None ordered If you have labs (blood work) drawn today and your tests are completely normal, you will receive your results only by: Marland Kitchen. MyChart Message (if you have MyChart) OR . A paper copy in the mail If you have any lab test that is abnormal or we need to change your treatment, we will call you to review the results.   Testing/Procedures: None ordered   Follow-Up: At [facility], you and your health needs are our priority.  As part of our continuing mission to provide you with exceptional heart care, we have created designated Provider Care Teams.  These Care Teams include your primary Cardiologist (physician) and Advanced Practice Providers (APPs -  Physician Assistants and Nurse Practitioners) who all work together to provide you with the care you need, when you need it.  We recommend signing up for the patient portal called "MyChart".  Sign up  information is provided on this After Visit Summary.  MyChart is used to connect with patients for Virtual Visits (Telemedicine).  Patients are able to view lab/test results, encounter notes, upcoming appointments, etc.  Non-urgent messages can be sent to your provider as well.   To learn more about what you can do with MyChart, go to [email or web address].    Your next appointment:   6 month(s)  The format for your next appointment:   In Person  Provider:   Thurmon FairMihai Teresina Bugaj, MD      Signed, Thurmon FairMihai Farrell Broerman, MD  01/24/2020 4:42 PM    [facility]

## 2020-06-19 ENCOUNTER — Ambulatory Visit (INDEPENDENT_AMBULATORY_CARE_PROVIDER_SITE_OTHER): Payer: Managed Care, Other (non HMO) | Admitting: Orthopedic Surgery

## 2020-06-19 ENCOUNTER — Ambulatory Visit (INDEPENDENT_AMBULATORY_CARE_PROVIDER_SITE_OTHER): Payer: Managed Care, Other (non HMO)

## 2020-06-19 DIAGNOSIS — M79642 Pain in left hand: Secondary | ICD-10-CM

## 2020-06-24 ENCOUNTER — Encounter: Payer: Self-pay | Admitting: Orthopedic Surgery

## 2020-06-24 NOTE — Progress Notes (Signed)
Office Visit Note   Patient: Edward Obrien           Date of Birth: [birth year]           MRN: [id number] Visit Date: 06/19/2020 Requested by: Edward Inch, MD [address] [place],  [place] [address] PCP: Edward Inch, MD  Subjective: Chief Complaint  Patient presents with  . 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  Injured in June of this year when he tripped and fell on the hand.  He went to urgent care and advised it was negative for fracture.  Those radiographs are not available for review.  Reports continued pain.  States he does have full range of motion but difficulty occasionally with grip.  If he bumps his hand he occasionally will get severe pain.  He is right-hand dominant.  Denies any numbness and tingling.  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  He states that the area around the fourth and fifth metacarpal head on the dorsal and palmar aspect of the hand became bruised.              ROS: All systems reviewed are negative as they relate to the chief complaint within the history of present illness.  Patient denies  fevers or chills.   Assessment & Plan: Visit Diagnoses:  1. Pain in left hand     Plan: Impression is left hand pain with history that sounds like he may have had a nondisplaced fracture.  Careful scrutiny of the radiographs today do not show much in terms of callus formation but I think it is possible he may have had a nondisplaced fracture spiral through the fourth proximal phalanx.  His history sounds like that may be the case but it is hard to prove that definitively.  Nonetheless he has full range of motion now with no long-lasting effects from the injury.  No scaphoid tenderness and otherwise normal radiographs.  Follow-up as needed.  Follow-Up Instructions: No follow-ups on file.   Orders:  Orders Placed This Encounter  Procedures  . XR Hand Complete Left   No orders of the  defined types were placed in this encounter.     Procedures: No procedures performed   Clinical Data: No additional findings.  Objective: Vital Signs: There were no vitals taken for this visit.  Physical Exam:   Constitutional: Patient appears well-developed HEENT:  Head: Normocephalic Eyes:EOM are normal Neck: Normal range of motion Cardiovascular: Normal rate Pulmonary/chest: Effort normal Neurologic: Patient is alert Skin: Skin is warm Psychiatric: Patient has normal mood and affect    Ortho Exam: Ortho exam demonstrates no rotational deformity of the fingers with range of motion.  Flexion extension intact.  No discrete tenderness along the phalanges or metacarpals of the hand.  Radial pulses intact.  No tenderness in the snuffbox.  No tenderness over the radial styloid.  Wrist flexion extension radial and ulnar deviation full symmetric and intact.  Specialty Comments:  No specialty comments available.  Imaging: No results found.   PMFS History: Patient Active Problem List   Diagnosis Date Noted  . Hypotension 11/12/2019  . Chest pain 10/30/2019  . Exertional dyspnea 10/30/2019  . Hyperglycemia 10/30/2019  . OSA (obstructive sleep apnea)   . Morbid obesity (HCC)   . SVT (supraventricular tachycardia) (HCC)    Past Medical History:  Diagnosis Date  . CAD (  coronary artery disease) 10/30/2019   mild-mod by cardiac CT>>med rx  . HTN (hypertension)   . Hyperlipidemia LDL goal <70   . OSA on CPAP 11/2019  . SVT (supraventricular tachycardia) (HCC)     Family History  Problem Relation Age of Onset  . Alzheimer's disease Mother   . Hypertension Mother   . Diabetes Mother   . Failure to thrive Father     Past Surgical History:  Procedure Laterality Date  . CARDIAC CATHETERIZATION  10/30/2019   mild-mod dz>>med rx  . VASECTOMY     Social History   Occupational History  . Not on file  Tobacco Use  . Smoking status: Current Every Day Smoker     Types: Cigars  . Smokeless tobacco: Never Used  Substance and Sexual Activity  . Alcohol use: Never  . Drug use: Never  . Sexual activity: Yes

## 2020-10-03 ENCOUNTER — Encounter: Payer: Self-pay | Admitting: Cardiovascular Disease

## 2020-10-03 ENCOUNTER — Ambulatory Visit: Payer: Managed Care, Other (non HMO) | Admitting: Cardiovascular Disease

## 2020-10-03 ENCOUNTER — Other Ambulatory Visit: Payer: Self-pay

## 2020-10-03 VITALS — BP 126/76 | HR 64 | Ht 76.0 in | Wt 264.2 lb

## 2020-10-03 DIAGNOSIS — E785 Hyperlipidemia, unspecified: Secondary | ICD-10-CM | POA: Diagnosis not present

## 2020-10-03 DIAGNOSIS — I2510 Atherosclerotic heart disease of native coronary artery without angina pectoris: Secondary | ICD-10-CM

## 2020-10-03 DIAGNOSIS — G4733 Obstructive sleep apnea (adult) (pediatric): Secondary | ICD-10-CM

## 2020-10-03 DIAGNOSIS — Z87898 Personal history of other specified conditions: Secondary | ICD-10-CM

## 2020-10-03 DIAGNOSIS — I10 Essential (primary) hypertension: Secondary | ICD-10-CM

## 2020-10-03 DIAGNOSIS — R600 Localized edema: Secondary | ICD-10-CM

## 2020-10-03 DIAGNOSIS — E669 Obesity, unspecified: Secondary | ICD-10-CM

## 2020-10-03 DIAGNOSIS — I471 Supraventricular tachycardia: Secondary | ICD-10-CM

## 2020-10-03 NOTE — Patient Instructions (Signed)
Medication Instructions:  No changes *If you need a refill on your cardiac medications before your next appointment, please call your pharmacy*   Lab Work: None ordered If you have labs (blood work) drawn today and your tests are completely normal, you will receive your results only by: Marland Kitchen MyChart Message (if you have MyChart) OR . A paper copy in the mail If you have any lab test that is abnormal or we need to change your treatment, we will call you to review the results.   Testing/Procedures: Your physician has requested that you have a lower extremity venous duplex. This test is an ultrasound of the veins in the legs. It looks at venous blood flow that carries blood from the heart to the legs. Allow one hour for a Lower Venous exam.  There are no restrictions or special instructions.   Follow-Up: At [facility], you and your health needs are our priority.  As part of our continuing mission to provide you with exceptional heart care, we have created designated Provider Care Teams.  These Care Teams include your primary Cardiologist (physician) and Advanced Practice Providers (APPs -  Physician Assistants and Nurse Practitioners) who all work together to provide you with the care you need, when you need it.  We recommend signing up for the patient portal called "MyChart".  Sign up information is provided on this After Visit Summary.  MyChart is used to connect with patients for Virtual Visits (Telemedicine).  Patients are able to view lab/test results, encounter notes, upcoming appointments, etc.  Non-urgent messages can be sent to your provider as well.   To learn more about what you can do with MyChart, go to [email or web address].    Your next appointment:   6 month(s)  The format for your next appointment:   In Person  Provider:   You may see Thurmon Fair, MD or one of the following Advanced Practice Providers on your designated Care Team:    Azalee Course, PA-C  Micah Flesher,  PA-C or   Judy Pimple, [place]

## 2020-10-03 NOTE — Progress Notes (Signed)
Cardiology Office Note:    Date:  10/03/2020   ID:  Edward Obrien, DOB 08-Oct-1970, MRN [id number]  PCP:  Eartha Inch, MD  [facility] Cardiologist:  Zoejane Gaulin [facility] Electrophysiologist:  None   Referring MD: Eartha Inch, MD   Chief Complaint  Patient presents with  . Follow-up    History of Present Illness:    Edward Obrien is a 50 y.o. male with a hx of severely elevated calcium score but no significant coronary stenosis by recent coronary CT angiography, essential hypertension, obstructive sleep apnea, dyslipidemia (very low HDL), obesity.  In April 2021, he was hospitalized at [facility] with chest pain.  The coronary CT angiogram did not show any significant obstruction, but suggested that he does have gastroesophageal reflux since there was substantial fluid in his thoracic esophagus.  He also had symptoms strongly suggestive of obstructive sleep apnea.  He has made remarkable lifestyle changes and has lost more than 100 pounds in the last year.  Blood pressure medications had to be stopped for symptomatic hypotension (he had a brief syncopal event and his blood pressure was in the 90/50 range).  BMI remains approximately 32, but he no longer requires any medications for hypertension and his lipid profile and glucose levels have completely normalized.  His most recent hemoglobin A1c was 5.2%.  His HDL was 67 and LDL 95.   He continues to be compliant with CPAP.  He has made some remarkable changes in his lifestyle. He has noticed that he has markedly improved quality of sleep, wakes up feeling energetic and can put in a whole day's work without ever feeling tired or needing a nap.  His apnea/hypopnea index has gone from 31 to 1.  Occasionally cheats on his diet and develops some lower extremity edema if his intake of salt increases.  Still taking furosemide as needed.  Still has swelling when he woke up this morning, even after taking furosemide.  Has some  degree of asymmetrical edema, worse on the left compared to the right.  No tenderness or color change.    Even more impressive is his commitment to healthy diet and exercise.  He walks 5 miles every morning 5 days a week at a pace of 15-18-minute miles.  He walks on the path since [place] which can be quite hilly.  He has noticed a peak heart rate of 240 bpm on his smart watch.  A typical day will include 20,000 steps.  He is eating an almost vegan diet.  He is careful to include lots of vitamins and oligo elements and eats a lot of fresh fruits and vegetables.  He keeps dietary log to make sure that he is not missing out on important nutrients.   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  A previous 48-hour Holter monitor showed an 8 beat run of SVT, but no serious bradycardia or pauses or VT.  His echocardiogram showed a normal left ventricular size and function, EF 55-60%, "grade 1 diastolic dysfunction", mildly enlarged right ventricle with normal systolic function, dilated aortic root at 4.0 cm (uncertain whether this is truly abnormal for a 6 foot 4, 350 pound gentleman).  Past Medical History:  Diagnosis Date  . CAD (coronary artery disease) 10/30/2019   mild-mod by cardiac CT>>med rx  . HTN (hypertension)   . Hyperlipidemia LDL goal <70   . OSA on CPAP 11/2019  . SVT (supraventricular tachycardia) (HCC)     Past Surgical History:  Procedure  Laterality Date  . CARDIAC CATHETERIZATION  10/30/2019   mild-mod dz>>med rx  . VASECTOMY      Current Medications: Current Meds  Medication Sig  . aspirin EC 81 MG tablet Take 1 tablet (81 mg total) by mouth daily.  Marland Kitchen aspirin-acetaminophen-caffeine (EXCEDRIN MIGRAINE) 250-250-65 MG tablet Take 1 tablet by mouth every 6 (six) hours as needed for headache.  . furosemide (LASIX) 20 MG tablet Take 20 mg by mouth as needed.  . Multiple Vitamin (MULTIVITAMIN) capsule Take 1 capsule by mouth daily.  . nitroGLYCERIN (NITROSTAT) 0.4 MG SL  tablet Place 1 tablet (0.4 mg total) under the tongue every 5 (five) minutes as needed for chest pain.  . Omega 3 1200 MG CAPS Take 2 capsules by mouth daily.  . Potassium Chloride ER 20 MEQ TBCR Take 20 mEq by mouth as needed.     Allergies:   Patient has no known allergies.   Social History   Socioeconomic History  . Marital status: Married    Spouse name: Not on file  . Number of children: Not on file  . Years of education: Not on file  . Highest education level: Not on file  Occupational History  . Not on file  Tobacco Use  . Smoking status: Current Every Day Smoker    Types: Cigars  . Smokeless tobacco: Never Used  Substance and Sexual Activity  . Alcohol use: Never  . Drug use: Never  . Sexual activity: Yes  Other Topics Concern  . Not on file  Social History Narrative  . Not on file   Social Determinants of Health   Financial Resource Strain: Not on file  Food Insecurity: Not on file  Transportation Needs: Not on file  Physical Activity: Not on file  Stress: Not on file  Social Connections: Not on file     Family History: The patient's family history includes Alzheimer's disease in his mother; Diabetes in his mother; Failure to thrive in his father; Hypertension in his mother.  ROS:   Please see the history of present illness.     All other systems reviewed and are negative.  EKGs/Labs/Other Studies Reviewed:    The following studies were reviewed today: ED visit notes and labs  EKG:  EKG is ordered today.  It shows sinus rhythm and right bundle branch block.  Otherwise normal  Recent Labs: 10/30/2019: B Natriuretic Peptide 14.9 11/12/2019: Magnesium 2.0 11/13/2019: ALT 30 12/15/2019: BUN 15; Creatinine, Ser 1.11; Hemoglobin 13.5; Platelets 169; Potassium 3.6; Sodium 138  Recent Lipid Panel    Component Value Date/Time   CHOL 167 10/31/2019 0246   TRIG 184 (H) 10/31/2019 0246   HDL 28 (L) 10/31/2019 0246   CHOLHDL 6.0 10/31/2019 0246   VLDL 37  10/31/2019 0246   LDLCALC 102 (H) 10/31/2019 0246    Physical Exam:    VS:  BP 126/76   Pulse 64   Ht 6\' 4"  (1.93 m)   Wt 264 lb 3.2 oz (119.8 kg)   SpO2 96%   BMI 32.16 kg/m     Wt Readings from Last 3 Encounters:  10/03/20 264 lb 3.2 oz (119.8 kg)  01/23/20 (!) 331 lb 3.2 oz (150.2 kg)  12/15/19 (!) 374 lb 12.5 oz (170 kg)     GEN: Morbidly obese, but appears fit; well nourished, well developed in no acute distress HEENT: Normal NECK: No JVD; No carotid bruits LYMPHATICS: No lymphadenopathy CARDIAC: RRR, no murmurs, rubs, gallops RESPIRATORY:  Clear to auscultation without  rales, wheezing or rhonchi  ABDOMEN: Soft, non-tender, non-distended MUSCULOSKELETAL:  No edema; No deformity  SKIN: Warm and dry NEUROLOGIC:  Alert and oriented x 3 PSYCHIATRIC:  Normal affect   ASSESSMENT:    1. Coronary artery disease involving native coronary artery of native heart without angina pectoris   2. Dyslipidemia   3. History of prediabetes   4. OSA (obstructive sleep apnea)   5. Edema of both lower extremities   6. Mild obesity   7. SVT (supraventricular tachycardia) (HCC)   8. Essential hypertension    PLAN:    In order of problems listed above:  1. CAD: High coronary calcium burden, but no significant obstruction by angiography and asymptomatic despite intense physical activity.  Had to stop beta-blockers after he lost weight.  On aspirin.  With weight loss and exercise, his lipid profile has improved to the degree that statin is no longer a compelling indication. 2. HLP: Exceptional improvement in his HDL from a low of 28 to the current value of 67.  LDL cholesterol of 95 remains formally above our target range of LDL 70 for secondary prevention, but he has not actually had any coronary events.  Prefers not to take medications and I think that is reasonable. 3. PreDM: Cured with weight loss. 4. OSA: Compliant with CPAP and great benefit with this therapy for his hypersomnolence.   I wonder whether his sleep apnea is not also improved after losing so much weight. 5. Edema: This continues to be a problem and I wonder whether he still has some degree of right heart failure from years of obesity and untreated sleep apnea, although he clearly also has some varicose veins.  We will do a lower extremity venous reflux study. 6. Mild obesity: He is rapidly approaching his goal of 240 pound weight management moving out of obesity range. 7. SVT: Brief nonsustained event incidentally noted on arrhythmia monitor.  Unlikely to be clinically significant.  This may have been related to untreated sleep apnea.  No pharmacological therapy indicated. 8. HTN: At this level of his antihypertensives due to episodes of hypotension and even syncope.  Current blood pressure is excellent.   Medication Adjustments/Labs and Tests Ordered: Current medicines are reviewed at length with the patient today.  Concerns regarding medicines are outlined above.  Orders Placed This Encounter  Procedures  . EKG 12-Lead  . VAS [place] LOWER EXTREMITY VENOUS REFLUX   No orders of the defined types were placed in this encounter.   Patient Instructions  Medication Instructions:  No changes *If you need a refill on your cardiac medications before your next appointment, please call your pharmacy*   Lab Work: None ordered If you have labs (blood work) drawn today and your tests are completely normal, you will receive your results only by: Marland Kitchen MyChart Message (if you have MyChart) OR . A paper copy in the mail If you have any lab test that is abnormal or we need to change your treatment, we will call you to review the results.   Testing/Procedures: Your physician has requested that you have a lower extremity venous duplex. This test is an ultrasound of the veins in the legs. It looks at venous blood flow that carries blood from the heart to the legs. Allow one hour for a Lower Venous exam.  There are no  restrictions or special instructions.   Follow-Up: At [facility], you and your health needs are our priority.  As part of our continuing mission to  provide you with exceptional heart care, we have created designated Provider Care Teams.  These Care Teams include your primary Cardiologist (physician) and Advanced Practice Providers (APPs -  Physician Assistants and Nurse Practitioners) who all work together to provide you with the care you need, when you need it.  We recommend signing up for the patient portal called "MyChart".  Sign up information is provided on this After Visit Summary.  MyChart is used to connect with patients for Virtual Visits (Telemedicine).  Patients are able to view lab/test results, encounter notes, upcoming appointments, etc.  Non-urgent messages can be sent to your provider as well.   To learn more about what you can do with MyChart, go to [email or web address].    Your next appointment:   6 month(s)  The format for your next appointment:   In Person  Provider:   You may see Thurmon FairMihai Traye Bates, MD or one of the following Advanced Practice Providers on your designated Care Team:    Azalee CourseHao Meng, PA-C  Micah FlesherAngela Duke, [place]PA-C or   Judy PimpleKrista Kroeger, PA-C     Signed, Thurmon FairMihai Kristyana Notte, MD  10/03/2020 11:04 AM    [facility]

## 2020-10-09 ENCOUNTER — Other Ambulatory Visit: Payer: Self-pay

## 2020-10-09 ENCOUNTER — Ambulatory Visit (HOSPITAL_COMMUNITY)
Admission: RE | Admit: 2020-10-09 | Discharge: 2020-10-09 | Disposition: A | Discharge status: Home / Self Care | Payer: Managed Care, Other (non HMO) | Source: Ambulatory Visit | Attending: Cardiology | Admitting: Cardiology

## 2020-10-09 DIAGNOSIS — R600 Localized edema: Secondary | ICD-10-CM | POA: Diagnosis present

## 2021-04-01 ENCOUNTER — Encounter: Payer: Self-pay | Admitting: *Deleted

## 2021-04-04 ENCOUNTER — Ambulatory Visit: Payer: Managed Care, Other (non HMO) | Admitting: Cardiovascular Disease

## 2021-05-23 ENCOUNTER — Ambulatory Visit: Payer: Managed Care, Other (non HMO) | Admitting: Cardiovascular Disease

## 2021-05-23 ENCOUNTER — Encounter: Payer: Self-pay | Admitting: Cardiovascular Disease

## 2021-05-23 ENCOUNTER — Other Ambulatory Visit: Payer: Self-pay

## 2021-05-23 VITALS — BP 141/90 | HR 52 | Ht 76.0 in | Wt 293.8 lb

## 2021-05-23 DIAGNOSIS — I872 Venous insufficiency (chronic) (peripheral): Secondary | ICD-10-CM

## 2021-05-23 DIAGNOSIS — I10 Essential (primary) hypertension: Secondary | ICD-10-CM

## 2021-05-23 DIAGNOSIS — I2510 Atherosclerotic heart disease of native coronary artery without angina pectoris: Secondary | ICD-10-CM

## 2021-05-23 DIAGNOSIS — R600 Localized edema: Secondary | ICD-10-CM | POA: Diagnosis not present

## 2021-05-23 DIAGNOSIS — G4733 Obstructive sleep apnea (adult) (pediatric): Secondary | ICD-10-CM

## 2021-05-23 DIAGNOSIS — E785 Hyperlipidemia, unspecified: Secondary | ICD-10-CM

## 2021-05-23 DIAGNOSIS — I471 Supraventricular tachycardia: Secondary | ICD-10-CM

## 2021-05-23 MED ORDER — FUROSEMIDE 20 MG PO TABS: 20.0000 mg | ORAL_TABLET | ORAL | 1 refills | Status: DC | PRN

## 2021-05-23 MED ORDER — LISINOPRIL 10 MG PO TABS
10.0000 mg | ORAL_TABLET | Freq: Every day | ORAL | 3 refills | Status: DC
Start: 2021-05-23 — End: 2022-05-25

## 2021-05-23 NOTE — Patient Instructions (Signed)
Medication Instructions:  TAKE Lisinopril 10 mg once daily  *If you need a refill on your cardiac medications before your next appointment, please call your pharmacy*   Lab Work: None ordered If you have labs (blood work) drawn today and your tests are completely normal, you will receive your results only by: MyChart Message (if you have MyChart) OR A paper copy in the mail If you have any lab test that is abnormal or we need to change your treatment, we will call you to review the results.   Testing/Procedures: None ordered   Follow-Up: At [facility], you and your health needs are our priority.  As part of our continuing mission to provide you with exceptional heart care, we have created designated Provider Care Teams.  These Care Teams include your primary Cardiologist (physician) and Advanced Practice Providers (APPs -  Physician Assistants and Nurse Practitioners) who all work together to provide you with the care you need, when you need it.  We recommend signing up for the patient portal called "MyChart".  Sign up information is provided on this After Visit Summary.  MyChart is used to connect with patients for Virtual Visits (Telemedicine).  Patients are able to view lab/test results, encounter notes, upcoming appointments, etc.  Non-urgent messages can be sent to your provider as well.   To learn more about what you can do with MyChart, go to [email or web address].    Your next appointment:   12 month(s)  The format for your next appointment:   In Person  Provider:   Thurmon Fair, MD

## 2021-05-23 NOTE — Progress Notes (Signed)
Cardiology Office Note:    Date:  05/23/2021   ID:  Edward Obrien, DOB 08-19-1970, MRN [id number]  PCP:  Edward Inch, MD  [facility] Cardiologist:  Edward Obrien [facility] Electrophysiologist:  None   Referring MD: Edward Inch, MD   Chief Complaint  Patient presents with   Hypertension     History of Present Illness:    Edward Obrien is a 50 y.o. male with a hx of severely elevated calcium score but no significant coronary stenosis by recent coronary CT angiography, essential hypertension, obstructive sleep apnea, dyslipidemia (very low HDL), obesity.  In April 2021, he was hospitalized at [facility] with chest pain.  The coronary CT angiogram did not show any significant obstruction, but suggested that he does have gastroesophageal reflux since there was substantial fluid in his thoracic esophagus.  He has continued to follow a very healthy lifestyle, but has actually gained back about 25 pounds since his last appointment.  He has also recently noticed occipital headaches in the mornings that are only partially relieved by Excedrin.  He started checking his blood pressure because of this and has noticed that his systolic blood pressure is frequently in the 150s.  He has also noticed some recurrence of ankle edema.  He had not been taking any blood pressure medications whatsoever for the last several months and has not taken any Lasix in several months either.  BMI has increased from 32-35.7.  His voice is still down to 36 inches, which is the best its been since [employer].  Hemoglobin A1c remains in normal range without any medication.  He reports that his LDL was in the 90s and his HDL was in the 60s at labs performed at work recently.    Weight loss efforts plateaued and he started to do some free weight exercises in addition to walking at least 15,000 steps a day.  He usually gets his exercise and first thing in the morning and estimates that overall he  exercises 1.5-2 hours a day (walks 5 miles every morning).  He reports 100% compliance with CPAP and has excellent sleep quality and excellent energy in the morning.  His smart watch app states that he has had most of an AHI of 1/h.  His resting heart rate is in the 50s and his phone will sometimes alert him that his heart rate is in the 40s at night.  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  A previous 48-hour Holter monitor showed an 8 beat run of SVT, but no serious bradycardia or pauses or VT.  His echocardiogram showed a normal left ventricular size and function, EF 55-60%, "grade 1 diastolic dysfunction", mildly enlarged right ventricle with normal systolic function, dilated aortic root at 4.0 cm (uncertain whether this is truly abnormal for a 6 foot 4, 350 pound gentleman).  Past Medical History:  Diagnosis Date   CAD (coronary artery disease) 10/30/2019   mild-mod by cardiac CT>>med rx   HTN (hypertension)    Hyperlipidemia LDL goal <70    OSA on CPAP 11/2019   SVT (supraventricular tachycardia) (HCC)     Past Surgical History:  Procedure Laterality Date   CARDIAC CATHETERIZATION  10/30/2019   mild-mod dz>>med rx   VASECTOMY      Current Medications: Current Meds  Medication Sig   aspirin-acetaminophen-caffeine (EXCEDRIN MIGRAINE) 250-250-65 MG tablet Take 1 tablet by mouth every 6 (six) hours as needed for headache.   lisinopril (ZESTRIL) 10 MG tablet Take  1 tablet (10 mg total) by mouth daily.   Multiple Vitamin (MULTIVITAMIN) capsule Take 1 capsule by mouth daily.   nitroGLYCERIN (NITROSTAT) 0.4 MG SL tablet Place 1 tablet (0.4 mg total) under the tongue every 5 (five) minutes as needed for chest pain.   Omega 3 1200 MG CAPS Take 2 capsules by mouth daily.     Allergies:   Patient has no known allergies.   Social History   Socioeconomic History   Marital status: Married    Spouse name: Not on file   Number of children: Not on file   Years of education: Not  on file   Highest education level: Not on file  Occupational History   Not on file  Tobacco Use   Smoking status: Every Day    Types: Cigars   Smokeless tobacco: Never  Substance and Sexual Activity   Alcohol use: Never   Drug use: Never   Sexual activity: Yes  Other Topics Concern   Not on file  Social History Narrative   Not on file   Social Determinants of Health   Financial Resource Strain: Not on file  Food Insecurity: Not on file  Transportation Needs: Not on file  Physical Activity: Not on file  Stress: Not on file  Social Connections: Not on file     Family History: The patient's family history includes Alzheimer's disease in his mother; Diabetes in his mother; Failure to thrive in his father; Hypertension in his mother.  ROS:   Please see the history of present illness.     All other systems reviewed and are negative.  EKGs/Labs/Other Studies Reviewed:    The following studies were reviewed today: ED visit notes and labs  EKG:  EKG is ordered today.  It shows sinus rhythm and right bundle branch block.  Otherwise normal  Recent Labs: No results found for requested labs within last 8760 hours.  Recent Lipid Panel    Component Value Date/Time   CHOL 167 10/31/2019 0246   TRIG 184 (H) 10/31/2019 0246   HDL 28 (L) 10/31/2019 0246   CHOLHDL 6.0 10/31/2019 0246   VLDL 37 10/31/2019 0246   LDLCALC 102 (H) 10/31/2019 0246    Physical Exam:    VS:  BP (!) 141/90   Pulse (!) 52   Ht 6\' 4"  (1.93 m)   Wt 293 lb 12.8 oz (133.3 kg)   SpO2 99%   BMI 35.76 kg/m     Wt Readings from Last 3 Encounters:  05/23/21 293 lb 12.8 oz (133.3 kg)  10/03/20 264 lb 3.2 oz (119.8 kg)  01/23/20 (!) 331 lb 3.2 oz (150.2 kg)     General: Alert, oriented x3, no distress, severely obese, but appears very fit Head: no evidence of trauma, PERRL, EOMI, no exophtalmos or lid lag, no myxedema, no xanthelasma; normal ears, nose and oropharynx Neck: normal jugular venous  pulsations and no hepatojugular reflux; brisk carotid pulses without delay and no carotid bruits Chest: clear to auscultation, no signs of consolidation by percussion or palpation, normal fremitus, symmetrical and full respiratory excursions Cardiovascular: normal position and quality of the apical impulse, regular rhythm, normal first and widely split second heart sounds, no murmurs, rubs or gallops Abdomen: no tenderness or distention, no masses by palpation, no abnormal pulsatility or arterial bruits, normal bowel sounds, no hepatosplenomegaly Extremities: no clubbing, cyanosis or edema; 2+ radial, ulnar and brachial pulses bilaterally; 2+ right femoral, posterior tibial and dorsalis pedis pulses; 2+ left femoral, posterior  tibial and dorsalis pedis pulses; no subclavian or femoral bruits Neurological: grossly nonfocal Psych: Normal mood and affect   ASSESSMENT:    1. Coronary artery disease involving native coronary artery of native heart without angina pectoris     PLAN:    In order of problems listed above:  CAD: He had a high coronary calcium score, but CT angiography showed no obstruction and he does not have angina with intense physical activity.  Resting bradycardia precludes use of beta-blockers.  He is taking aspirin.  No longer requires statin after lifestyle improvements.   HLP/PreDM: Marked improvement with lifestyle changes.  I asked him to send me a copy of his most recent labs.  He does not require medications for either his lipids or his glucose control. OSA: 100% compliant with CPAP with excellent improvement in sleep quality and energy level. Edema: Does have varicose veins and mild reflux on lower extremity venous study. May have some degree of right heart dysfunction from years of OSA/obesity - has a RBBB. Keep legs elevated. Severe obesity: He had set a goal of 240 pound weight, but has lost ground in his weight loss efforts.  I suspect that while some of his weight gain  may be fluid, at least part of it is real weight gain. SVT: He denies any palpitations.  Brief nonsustained event incidentally noted on arrhythmia monitor.  Unlikely to be clinically significant.  This may have been related to untreated sleep apnea.  No pharmacological therapy indicated. HTN: We will restart lisinopril 10 mg once daily.  Asked him to send me some blood pressure readings after couple of weeks.  Can continue to take furosemide "as needed" for edema. Peripheral venous insufficiency: Mild venous reflux demonstrated on ultrasound study.  Does not appear to need invasive treatment.   Medication Adjustments/Labs and Tests Ordered: Current medicines are reviewed at length with the patient today.  Concerns regarding medicines are outlined above.  Orders Placed This Encounter  Procedures   EKG 12-Lead    Meds ordered this encounter  Medications   furosemide (LASIX) 20 MG tablet    Sig: Take 1 tablet (20 mg total) by mouth as needed for edema.    Dispense:  30 tablet    Refill:  1   lisinopril (ZESTRIL) 10 MG tablet    Sig: Take 1 tablet (10 mg total) by mouth daily.    Dispense:  90 tablet    Refill:  3     Patient Instructions  Medication Instructions:  TAKE Lisinopril 10 mg once daily  *If you need a refill on your cardiac medications before your next appointment, please call your pharmacy*   Lab Work: None ordered If you have labs (blood work) drawn today and your tests are completely normal, you will receive your results only by: MyChart Message (if you have MyChart) OR A paper copy in the mail If you have any lab test that is abnormal or we need to change your treatment, we will call you to review the results.   Testing/Procedures: None ordered   Follow-Up: At [facility], you and your health needs are our priority.  As part of our continuing mission to provide you with exceptional heart care, we have created designated Provider Care Teams.  These Care  Teams include your primary Cardiologist (physician) and Advanced Practice Providers (APPs -  Physician Assistants and Nurse Practitioners) who all work together to provide you with the care you need, when you need it.  We recommend  signing up for the patient portal called "MyChart".  Sign up information is provided on this After Visit Summary.  MyChart is used to connect with patients for Virtual Visits (Telemedicine).  Patients are able to view lab/test results, encounter notes, upcoming appointments, etc.  Non-urgent messages can be sent to your provider as well.   To learn more about what you can do with MyChart, go to [email or web address].    Your next appointment:   12 month(s)  The format for your next appointment:   In Person  Provider:   Thurmon Fair, MD       Signed, Thurmon Fair, MD  05/23/2021 11:20 AM    [facility]

## 2021-06-15 ENCOUNTER — Other Ambulatory Visit: Payer: Self-pay | Admitting: Cardiovascular Disease

## 2021-06-29 ENCOUNTER — Other Ambulatory Visit: Payer: Self-pay | Admitting: Cardiovascular Disease

## 2021-09-20 ENCOUNTER — Other Ambulatory Visit: Payer: Self-pay

## 2021-09-20 ENCOUNTER — Emergency Department (HOSPITAL_COMMUNITY): Payer: Managed Care, Other (non HMO)

## 2021-09-20 ENCOUNTER — Encounter (HOSPITAL_COMMUNITY): Payer: Self-pay | Admitting: Emergency Medicine

## 2021-09-20 ENCOUNTER — Emergency Department (HOSPITAL_COMMUNITY)
Admission: EM | Admit: 2021-09-20 | Discharge: 2021-09-20 | Disposition: A | Discharge status: Home / Self Care | Payer: Managed Care, Other (non HMO) | Attending: Emergency Medicine | Admitting: Emergency Medicine

## 2021-09-20 DIAGNOSIS — F17290 Nicotine dependence, other tobacco product, uncomplicated: Secondary | ICD-10-CM | POA: Diagnosis not present

## 2021-09-20 DIAGNOSIS — Z20822 Contact with and (suspected) exposure to covid-19: Secondary | ICD-10-CM | POA: Insufficient documentation

## 2021-09-20 DIAGNOSIS — R569 Unspecified convulsions: Secondary | ICD-10-CM

## 2021-09-20 DIAGNOSIS — R479 Unspecified speech disturbances: Secondary | ICD-10-CM

## 2021-09-20 DIAGNOSIS — I10 Essential (primary) hypertension: Secondary | ICD-10-CM | POA: Diagnosis not present

## 2021-09-20 DIAGNOSIS — I2510 Atherosclerotic heart disease of native coronary artery without angina pectoris: Secondary | ICD-10-CM | POA: Diagnosis not present

## 2021-09-20 LAB — I-STAT CHEM 8, ED
BUN: 17 mg/dL (ref 6–20)
Calcium, Ion: 1.05 mmol/L — ABNORMAL LOW (ref 1.15–1.40)
Chloride: 104 mmol/L (ref 98–111)
Creatinine, Ser: 1.5 mg/dL — ABNORMAL HIGH (ref 0.61–1.24)
Glucose, Bld: 90 mg/dL (ref 70–99)
HCT: 39 % (ref 39.0–52.0)
Hemoglobin: 13.3 g/dL (ref 13.0–17.0)
Potassium: 3.4 mmol/L — ABNORMAL LOW (ref 3.5–5.1)
Sodium: 138 mmol/L (ref 135–145)
TCO2: 22 mmol/L (ref 22–32)

## 2021-09-20 LAB — COMPREHENSIVE METABOLIC PANEL
ALT: 30 U/L (ref 0–44)
AST: 44 U/L — ABNORMAL HIGH (ref 15–41)
Albumin: 4.1 g/dL (ref 3.5–5.0)
Alkaline Phosphatase: 44 U/L (ref 38–126)
Anion gap: 13 (ref 5–15)
BUN: 17 mg/dL (ref 6–20)
CO2: 20 mmol/L — ABNORMAL LOW (ref 22–32)
Calcium: 9.5 mg/dL (ref 8.9–10.3)
Chloride: 105 mmol/L (ref 98–111)
Creatinine, Ser: 1.07 mg/dL (ref 0.61–1.24)
GFR, Estimated: >60 mL/min (ref >60)
Glucose, Bld: 90 mg/dL (ref 70–99)
Potassium: 3.4 mmol/L — ABNORMAL LOW (ref 3.5–5.1)
Sodium: 138 mmol/L (ref 135–145)
Total Bilirubin: 0.7 mg/dL (ref 0.3–1.2)
Total Protein: 6.4 g/dL — ABNORMAL LOW (ref 6.5–8.1)

## 2021-09-20 LAB — URINALYSIS, ROUTINE W REFLEX MICROSCOPIC
Bilirubin Urine: NEGATIVE
Glucose, UA: NEGATIVE mg/dL
Hgb urine dipstick: NEGATIVE
Ketones, ur: NEGATIVE mg/dL
Leukocytes,Ua: NEGATIVE
Nitrite: NEGATIVE
Protein, ur: NEGATIVE mg/dL
Specific Gravity, Urine: 1.003 — ABNORMAL LOW (ref 1.005–1.030)
pH: 6 (ref 5.0–8.0)

## 2021-09-20 LAB — DIFFERENTIAL
Abs Immature Granulocytes: 0.02 10*3/uL (ref 0.00–0.07)
Basophils Absolute: 0.1 10*3/uL (ref 0.0–0.1)
Basophils Relative: 1 %
Eosinophils Absolute: 0.1 10*3/uL (ref 0.0–0.5)
Eosinophils Relative: 2 %
Immature Granulocytes: 0 %
Lymphocytes Relative: 41 %
Lymphs Abs: 2.5 10*3/uL (ref 0.7–4.0)
Monocytes Absolute: 0.5 10*3/uL (ref 0.1–1.0)
Monocytes Relative: 9 %
Neutro Abs: 2.9 10*3/uL (ref 1.7–7.7)
Neutrophils Relative %: 47 %

## 2021-09-20 LAB — RESP PANEL BY RT-PCR (FLU A&B, COVID) ARPGX2
Influenza A by PCR: NEGATIVE
Influenza B by PCR: NEGATIVE
SARS Coronavirus 2 by RT PCR: NEGATIVE

## 2021-09-20 LAB — CBC
HCT: 39.1 % (ref 39.0–52.0)
Hemoglobin: 14 g/dL (ref 13.0–17.0)
MCH: 32.2 pg (ref 26.0–34.0)
MCHC: 35.8 g/dL (ref 30.0–36.0)
MCV: 89.9 fL (ref 80.0–100.0)
Platelets: 190 10*3/uL (ref 150–400)
RBC: 4.35 MIL/uL (ref 4.22–5.81)
RDW: 11.9 % (ref 11.5–15.5)
WBC: 6.1 10*3/uL (ref 4.0–10.5)
nRBC: 0 % (ref 0.0–0.2)

## 2021-09-20 LAB — RAPID URINE DRUG SCREEN, HOSP PERFORMED
Amphetamines: NOT DETECTED
Barbiturates: NOT DETECTED
Benzodiazepines: NOT DETECTED
Cocaine: NOT DETECTED
Opiates: NOT DETECTED
Tetrahydrocannabinol: NOT DETECTED

## 2021-09-20 LAB — PROTIME-INR
INR: 1.1 (ref 0.8–1.2)
Prothrombin Time: 14.3 seconds (ref 11.4–15.2)

## 2021-09-20 LAB — ETHANOL: Alcohol, Ethyl (B): 242 mg/dL — ABNORMAL HIGH (ref <10)

## 2021-09-20 LAB — APTT: aPTT: 29 seconds (ref 24–36)

## 2021-09-20 MED ORDER — SODIUM CHLORIDE 0.9 % IV BOLUS
1000.0000 mL | Freq: Once | INTRAVENOUS | Status: AC
Start: 2021-09-20 — End: 2021-09-20
  Administered 2021-09-20: 1000 mL via INTRAVENOUS

## 2021-09-20 NOTE — ED Provider Notes (Signed)
?[facility]PT ?[facility] Emergency Department ?Provider Note ?MRN:  [id number]  ?Arrival date & time: 09/20/21    ? ?Chief Complaint   ?Code Stroke ?  ?History of Present Illness   ?Edward Obrien is a 51 y.o. year-old male with a history of hypertension, CAD presenting to the ED with chief complaint of code stroke. ? ?Patient was having a normal night having a few drinks with his family and then suddenly he exhibited flailing movements, jumping out of his chair and landing on the ground.  He then proceeded to "talk gibberish" nonsensical sounds to his wife.  He has never done this before and so EMS was called. ? ?Review of Systems  ?A thorough review of systems was obtained and all systems are negative except as noted in the HPI and PMH.  ? ?Patient's Health History   ? ?Past Medical History:  ?Diagnosis Date  ? CAD (coronary artery disease) 10/30/2019  ? mild-mod by cardiac CT>>med rx  ? HTN (hypertension)   ? Hyperlipidemia LDL goal <70   ? OSA on CPAP 11/2019  ? SVT (supraventricular tachycardia) (HCC)   ?  ?Past Surgical History:  ?Procedure Laterality Date  ? CARDIAC CATHETERIZATION  10/30/2019  ? mild-mod dz>>med rx  ? VASECTOMY    ?  ?Family History  ?Problem Relation Age of Onset  ? Alzheimer's disease Mother   ? Hypertension Mother   ? Diabetes Mother   ? Failure to thrive Father   ?  ?Social History  ? ?Socioeconomic History  ? Marital status: Married  ?  Spouse name: Not on file  ? Number of children: Not on file  ? Years of education: Not on file  ? Highest education level: Not on file  ?Occupational History  ? Not on file  ?Tobacco Use  ? Smoking status: Every Day  ?  Types: Cigars  ? Smokeless tobacco: Never  ?Substance and Sexual Activity  ? Alcohol use: Never  ? Drug use: Never  ? Sexual activity: Yes  ?Other Topics Concern  ? Not on file  ?Social History Narrative  ? Not on file  ? ?Social Determinants of Health  ? ?Financial Resource Strain: Not on file  ?Food Insecurity: Not on  file  ?Transportation Needs: Not on file  ?Physical Activity: Not on file  ?Stress: Not on file  ?Social Connections: Not on file  ?Intimate Partner Violence: Not on file  ?  ? ?Physical Exam  ? ?Vitals:  ? 09/20/21 0530 09/20/21 0645  ?BP: 98/62 101/65  ?Pulse: 62 60  ?Resp: 18 16  ?SpO2: 97% 98%  ?  ?CONSTITUTIONAL: Well-appearing, NAD ?NEURO/PSYCH:  Alert and oriented x 3, normal and symmetric strength and sensation, normal coordination, normal speech, no aphasia, no neglect, no visual field cuts ?EYES:  eyes equal and reactive ?ENT/NECK:  no LAD, no JVD ?CARDIO: Regular rate, well-perfused, normal S1 and S2 ?PULM:  CTAB no wheezing or rhonchi ?GI/GU:  non-distended, non-tender ?MSK/SPINE:  No gross deformities, no edema ?SKIN:  no rash, atraumatic ? ? ?*Additional and/or pertinent findings included in MDM below ? ?Diagnostic and Interventional Summary  ? ? EKG Interpretation ? ?Date/Time:  Saturday September 20 2021 01:44:08 EDT ?Ventricular Rate:  68 ?PR Interval:  190 ?QRS Duration: 173 ?QT Interval:  456 ?QTC Calculation: 485 ?R Axis:   99 ?Text Interpretation: Sinus rhythm RBBB and LPFB Confirmed by Kennis Carina [phone]) on 09/20/2021 1:50:03 AM ?  ? ?  ? ?Labs Reviewed  ?ETHANOL - Abnormal;  Notable for the following components:  ?    Result Value  ? Alcohol, Ethyl (B) 242 (*)   ? All other components within normal limits  ?COMPREHENSIVE METABOLIC PANEL - Abnormal; Notable for the following components:  ? Potassium 3.4 (*)   ? CO2 20 (*)   ? Total Protein 6.4 (*)   ? AST 44 (*)   ? All other components within normal limits  ?URINALYSIS, ROUTINE W REFLEX MICROSCOPIC - Abnormal; Notable for the following components:  ? Color, Urine STRAW (*)   ? Specific Gravity, Urine 1.003 (*)   ? All other components within normal limits  ?I-STAT CHEM 8, ED - Abnormal; Notable for the following components:  ? Potassium 3.4 (*)   ? Creatinine, Ser 1.50 (*)   ? Calcium, Ion 1.05 (*)   ? All other components within normal limits   ?RESP PANEL BY RT-PCR (FLU A&B, COVID) ARPGX2  ?PROTIME-INR  ?APTT  ?CBC  ?DIFFERENTIAL  ?RAPID URINE DRUG SCREEN, HOSP PERFORMED  ?  ?CT HEAD CODE STROKE WO CONTRAST  ?Final Result  ?  ?MR BRAIN WO CONTRAST    (Results Pending)  ?  ?Medications  ?sodium chloride 0.9 % bolus 1,000 mL (has no administration in time range)  ?  ? ?Procedures  /  Critical Care ?Procedures ? ?ED Course and Medical Decision Making  ?Initial Impression and Ddx ?DDx includes seizure, TIA, stroke.  Symptoms all resolved at this time.   ? ?Past medical/surgical history that increases complexity of ED encounter: None ? ?Interpretation of Diagnostics ?I personally reviewed the EKG and my interpretation is as follows: Sinus rhythm, right bundle branch block ?   ?Labs reveal an ethanol level greater than 200, otherwise no significant blood count or electrolyte disturbances.  CT head is without acute process ? ?Patient Reassessment and Ultimate Disposition/Management ?Patient continues to have normal neurological exam at this time.  Patient evaluated by neurology, who is favoring a possible seizure.  Recommending MRI, if reassuring outpatient EEG.  Signed out to oncoming provider at shift change. ? ?Patient management required discussion with the following services or consulting groups:  Neurology ? ?Complexity of Problems Addressed ?Acute illness or injury that poses threat of life of bodily function ? ?Additional Data Reviewed and Analyzed ?Further history obtained from: ?Further history from spouse/family member and Past medical history and medications listed in the EMR ? ?Additional Factors Impacting ED Encounter Risk ?Consideration of hospitalization ? ?Barth Kirks. Sedonia Small, MD ?[facility] Emergency Medicine ?[email or web address] ? ?Final Clinical Impressions(s) / ED Diagnoses  ? ?  ICD-10-CM   ?1. Speech disturbance, unspecified type  R47.9   ?  ?  ?ED Discharge Orders   ? ? None  ? ?  ?  ? ?Discharge Instructions  Discussed with and Provided to Patient:  ? ?Discharge Instructions   ?None ?  ? ?  ?Maudie Flakes, MD ?09/20/21 [phone] ? ?

## 2021-09-20 NOTE — ED Triage Notes (Addendum)
Pt BIB [facility], [facility] 0011, when family noticed pt having aphasia, slurred speech and unsteady gait. Reports ETOH use tonight. Symptoms resolved on arrival. ?

## 2021-09-20 NOTE — Discharge Instructions (Addendum)
Recommend outpatient follow-up with neurology. Your MRI Brain was normal. ? ?Department of Motor Vehicle (DMV) of [place] regulations for seizures - It is the patient?s responsibility to report the incidence of the seizure in the state of [place]. [place] has no statutory provision requiring physicians to report patients diagnosed with epilepsy or seizures to a central state agency. ? ?The recommended DMV regulation requirement for a driver in [place] for an individual with a seizure is that they be seizure-free for 6-12 months. However, the DMV may consider the following exceptions to this general rule where: (1) a physician-directed change in medication causes a seizure and the individual immediately resumes the previous therapy which controlled seizures; (2) there is a history of nocturnal seizures or seizures which do not involve loss of consciousness, loss of control of motor function, or loss of appropriate sensation and information process; and (3) an individual has a seizure disorder preceded by an aura (warning) lasting 2-3 minutes. While the [facility] may also give consideration to other unusual circumstances which may affect the general requirement that drivers be seizure-free for 6-12 months, interpretation of these circumstances and assignment of restrictions is at the discretion of the Medical Advisor. The DMV also considers compliance with medical therapy essential for safe driving. [facility] [place] Physician's Guide to [employer] (June, 1995 ed.)] The Department learns of an individual's condition by inquiring on the application form or renewal form, a physician's report to the [facility], an accident report or from correspondence from the individual. The person may be required to submit a Medical Report Form either annually or semi-annually. ? ?

## 2021-09-20 NOTE — ED Provider Notes (Signed)
?  Physical Exam  ?BP 101/65   Pulse 60   Resp 16   Ht 6\' 4"  (1.93 m)   Wt (!) 140.4 kg   SpO2 98%   BMI 37.67 kg/m?  ? ? ? ?Procedures  ?Procedures ? ?ED Course / MDM  ?  ?Medical Decision Making ?Amount and/or Complexity of Data Reviewed ?Labs: ordered. ?Radiology: ordered. ? ? ?37M presenting with limb flailing movement, incomprehensible speech, since resolved. Neuro feels possible seizure. Recommended outpatient EEG, MRI brain to rule out CVA. MRI pending. ? ? ?0845 ?MRI Brain results normal. Pt back to baseline, plan for outpatient neuro follow-up. ? ?IMPRESSION:  ?1. Normal noncontrast MRI appearance of the brain.  ?2. Mild to moderate paranasal sinus inflammation.  ? ? ? ?  ?Regan Lemming, MD ?09/20/21 [phone] ? ?

## 2021-09-20 NOTE — Consult Note (Signed)
NEUROLOGY CONSULTATION NOTE  ? ?Date of service: September 20, 2021 ?Patient Name: Edward Obrien ?MRN:  [id number] ?DOB:  07/31/1970 ?Reason for consult: "seizure" ?Requesting Provider: Maudie Flakes, MD ?_ _ _   _ __   _ __ _ _  __ __   _ __   __ _ ? ?History of Present Illness  ?JAYESH KUCHARSKI is a 51 y.o. male with PMH significant for HLD, OSA on CPAP, SVT who presents with an episode concerning for a seizure. ? ?He had 4 drinks of Vodka with orange juice today. He was sitting and talking to his family and out of nowhere, his head went back and back stretched, his body turned to the side and he jerked like a fish for 40 secs and was on the floor. His speech was garbled. He stood up afterwards but looked daze and if seeing through you. He was very confused. By the time EMS arrived, speech was slurry and his gait was abnormal and he was stumbling. ? ?No loss of bladder or bowel, did not bite his tongue, no prior hx of seizures, no prior hx of significant head injury with LOC, no prior strokes, no hx of ICH, no hx of CNS infection, no seizures in sleep, no seizures in his childhood. ? ?He was brought in to the ED as a code stroke but was back to his baseline on arrival. ? ?History obtained from patient and wife. He reports drinking alcohol 3-5 times a week and can drink from between 2-3 beers to half a vodka. Wife at bedside reports that she feels he drinks more often and a lot more. He has been drinking since 2011. He smokes cigars. ?  ?ROS  ? ?Constitutional Denies weight loss, fever and chills.   ?HEENT Denies changes in vision and hearing.   ?Respiratory Denies SOB and cough.   ?CV Denies palpitations and CP   ?GI Denies abdominal pain, nausea, vomiting and diarrhea.   ?GU Denies dysuria and urinary frequency.   ?MSK Denies myalgia and joint pain.   ?Skin Denies rash and pruritus.   ?Neurological Denies headache and syncope.   ?Psychiatric Denies recent changes in mood. Denies anxiety and depression.   ? ?Past  History  ? ?Past Medical History:  ?Diagnosis Date  ? CAD (coronary artery disease) 10/30/2019  ? mild-mod by cardiac CT>>med rx  ? HTN (hypertension)   ? Hyperlipidemia LDL goal <70   ? OSA on CPAP 11/2019  ? SVT (supraventricular tachycardia) ([place])   ? ?Past Surgical History:  ?Procedure Laterality Date  ? CARDIAC CATHETERIZATION  10/30/2019  ? mild-mod dz>>med rx  ? VASECTOMY    ? ?Family History  ?Problem Relation Age of Onset  ? Alzheimer's disease Mother   ? Hypertension Mother   ? Diabetes Mother   ? Failure to thrive Father   ? ?Social History  ? ?Socioeconomic History  ? Marital status: Married  ?  Spouse name: Not on file  ? Number of children: Not on file  ? Years of education: Not on file  ? Highest education level: Not on file  ?Occupational History  ? Not on file  ?Tobacco Use  ? Smoking status: Every Day  ?  Types: Cigars  ? Smokeless tobacco: Never  ?Substance and Sexual Activity  ? Alcohol use: Never  ? Drug use: Never  ? Sexual activity: Yes  ?Other Topics Concern  ? Not on file  ?Social History Narrative  ? Not on file  ? ?  Social Determinants of Health  ? ?Financial Resource Strain: Not on file  ?Food Insecurity: Not on file  ?Transportation Needs: Not on file  ?Physical Activity: Not on file  ?Stress: Not on file  ?Social Connections: Not on file  ? ?No Known Allergies ? ?Medications  ?(Not in a hospital admission) ?  ? ?Vitals  ? ?Vitals:  ? 09/20/21 0230 09/20/21 0245 09/20/21 0300 09/20/21 0315  ?BP: 116/69 121/70 112/69 115/68  ?Pulse: 74 66 67 76  ?Resp: 15 15 12  (!) 21  ?SpO2: 97% 94% 91% 91%  ?Weight:      ?Height:      ?  ? ?Body mass index is 37.67 kg/m?. ? ?Physical Exam  ? ?General: Laying comfortably in bed; in no acute distress.  ?HENT: Normal oropharynx and mucosa. Normal external appearance of ears and nose.  ?Neck: Supple, no pain or tenderness  ?CV: No JVD. No peripheral edema.  ?Pulmonary: Symmetric Chest rise. Normal respiratory effort.  ?Abdomen: Soft to touch, non-tender.   ?Ext: No cyanosis, edema, or deformity  ?Skin: No rash. Normal palpation of skin.   ?Musculoskeletal: Normal digits and nails by inspection. No clubbing.  ? ?Neurologic Examination  ?Mental status/Cognition: Alert, oriented to self, place, month and year, good attention.  ?Speech/language: Fluent, comprehension intact, object naming intact, repetition intact.  ?Cranial nerves:  ? CN II Pupils equal and reactive to light, no VF deficits   ? CN III,IV,VI EOM intact, no gaze preference or deviation, no nystagmus   ? CN V normal sensation in V1, V2, and V3 segments bilaterally   ? CN VII no asymmetry, no nasolabial fold flattening   ? CN VIII normal hearing to speech   ? CN IX & X normal palatal elevation, no uvular deviation   ? CN XI 5/5 head turn and 5/5 shoulder shrug bilaterally   ? CN XII midline tongue protrusion   ? ?Motor:  ?Muscle bulk: normal, tone normal, pronator drift none tremor none ?Mvmt Root Nerve  Muscle Right Left Comments  ?SA C5/6 Ax Deltoid 5 5   ?EF C5/6 Mc Biceps 5 5   ?EE C6/7/8 Rad Triceps 5 5   ?WF C6/7 Med FCR     ?WE C7/8 PIN ECU     ?F Ab C8/T1 U ADM/FDI 5 5   ?HF L1/2/3 Fem Illopsoas 5 5   ?KE L2/3/4 Fem Quad 5 5   ?DF L4/5 D Peron Tib Ant 5 5   ?PF S1/2 Tibial Grc/Sol 5 5   ? ?Reflexes: ? Right Left Comments  ?Pectoralis     ? Biceps (C5/6) 2 2   ?Brachioradialis (C5/6) 2 2   ? Triceps (C6/7) 2 2   ? Patellar (L3/4) 2 2   ? Achilles (S1)     ? Hoffman     ? Plantar     ?Jaw jerk   ? ?Sensation: ? Light touch Intact throughout  ? Pin prick   ? Temperature   ? Vibration   ?Proprioception   ? ?Coordination/Complex Motor:  ?- Finger to Nose intact BL ?- Heel to shin intact BL ?- Rapid alternating movement intact BL ?- Gait: Stride length normal. Arm swing poor. Base width narrow. ? ?Labs  ? ?CBC:  ?Recent Labs  ?Lab 09/20/21 ?0110 09/20/21 ?0115  ?WBC 6.1  --   ?NEUTROABS 2.9  --   ?HGB 14.0 13.3  ?HCT 39.1 39.0  ?MCV 89.9  --   ?PLT 190  --   ? ? ?  Basic Metabolic Panel:  ?Lab Results   ?Component Value Date  ? NA 138 09/20/2021  ? K 3.4 (L) 09/20/2021  ? CO2 20 (L) 09/20/2021  ? GLUCOSE 90 09/20/2021  ? BUN 17 09/20/2021  ? CREATININE 1.50 (H) 09/20/2021  ? CALCIUM 9.5 09/20/2021  ? GFRNONAA >60 09/20/2021  ? GFRAA >60 12/15/2019  ? ?Lipid Panel:  ?Lab Results  ?Component Value Date  ? LDLCALC 102 (H) 10/31/2019  ? ?HgbA1c:  ?Lab Results  ?Component Value Date  ? HGBA1C 6.0 (H) 10/30/2019  ? ?Urine Drug Screen:  ?   ?Component Value Date/Time  ? LABOPIA NONE DETECTED 09/20/2021 0110  ? COCAINSCRNUR NONE DETECTED 09/20/2021 0110  ? LABBENZ NONE DETECTED 09/20/2021 0110  ? AMPHETMU NONE DETECTED 09/20/2021 0110  ? THCU NONE DETECTED 09/20/2021 0110  ? LABBARB NONE DETECTED 09/20/2021 0110  ?  ?Alcohol Level  ?   ?Component Value Date/Time  ? ETH 242 (H) 09/20/2021 0110  ? ? ?CT Head without contrast(Personally reviewed): ?CTH was negative for a large hypodensity concerning for a large territory infarct or hyperdensity concerning for an ICH ? ?MRI Brain: ?pending ? ?rEEG:  ?Pending ? ?Blood alcohol level: 242. ?UDS negative. ? ?Impression  ? ?TEVION DERAS is a 51 y.o. male with PMH significant for HLD, OSA on CPAP, SVT who presents with an episode concerning for a first time seizure. The description of the episode appears to be consistent with a first time seizure. Seizure risk factors include EtOh use. Do not think this was withdrawal seizure as his EtOh level was quite elevated. Alcohol induced seizure is another possibility but unclear given he reports that he does not drink a lot, wife thinks otherwise. He is back to his baseline and exam is non focal. ? ?Recommendations  ?- MRI Brain without contrast ?- routine EEG outpatient ?- Follow up with neurology in 1-2 weeks. ?- Seizure precautions ?- Discussed no driving and no heavy machinery for 6 months. Has to be seizure free before he can resume driving. ?- If he has a second seizure, would recommend starting Keppra 500mg  BID. ?- Full discharge  seizure precautions listed below. ?______________________________________________________________________ ? ? ?Thank you for the opportunity to take part in the care of this patient. If you have any further que

## 2021-09-23 ENCOUNTER — Other Ambulatory Visit: Payer: Self-pay

## 2021-09-23 ENCOUNTER — Encounter: Payer: Self-pay | Admitting: Neurology

## 2021-09-23 ENCOUNTER — Ambulatory Visit: Payer: Managed Care, Other (non HMO) | Admitting: Neurology

## 2021-09-23 VITALS — BP 145/83 | HR 45 | Ht 76.0 in | Wt 308.0 lb

## 2021-09-23 DIAGNOSIS — G4733 Obstructive sleep apnea (adult) (pediatric): Secondary | ICD-10-CM | POA: Diagnosis not present

## 2021-09-23 DIAGNOSIS — G40909 Epilepsy, unspecified, not intractable, without status epilepticus: Secondary | ICD-10-CM

## 2021-09-23 NOTE — Progress Notes (Signed)
? ?[facility] ? ?PATIENT: Edward Obrien ?DOB: 1970-10-26 ? ?REQUESTING CLINICIAN: Ernie Avena, MD ?HISTORY FROM: Patient and spouse  ?REASON FOR VISIT: Seizure  ? ? ?HISTORICAL ? ?CHIEF COMPLAINT:  ?Chief Complaint  ?Patient presents with  ? New Patient (Initial Visit)  ?  Room 17, with wife  ?NP ED referral for possible seizure vs CVA/sched with pt over phone/av ?Pt states he is doing well, no new sx   ? ? ?HISTORY OF PRESENT ILLNESS:  ?This is a 51 year old gentleman past medical history of hypertension, sleep apnea on CPAP who is presenting after first lifetime seizure.  Patient stated that he remembers being in the kitchen with wife and daughter and next thing that he remembers, he is on the floor talking gibberish.  He states that he remembers when paramedic arrived to his house and remembers the ride to the hospital.  In the ED he was back to his normal self.  He did have a brain MRI which did not show any acute abnormality, EEG was not performed. ?Wife reported patient was standing then arched his back then fell to the ground and did have generalized tonic-clonic activity lasted about 40 seconds.  After which he appeared confused, was talking gibberish.  Wife helped him to the chair and he waited paramedics arrival.  He denies any previous history of seizures or seizure-like activity.  He states that night he did have 3 glasses of mixed drink (vodka) and usually drink 3-4 times per week.  His alcohol level was 242. ?Since leaving the hospital he is back to his normal self, denies any additional seizure-like event.  He was not started on any medication.  Currently he is not driving. ? ? ?Handedness: Right handed  ? ?Onset: September 20, 2021 ? ?Seizure Type: Unclear, likely generalized ? ?Current frequency: Only once ? ?Any injuries from seizures: bruises  ? ?Seizure risk factors: Maternal grandfather  ? ?Previous ASMs: None ? ?Currenty ASMs: None ? ?ASMs side effects: Not  applicable ? ?Brain Images: No acute abnormality on brain MRI ? ?Previous EEGs: None ? ? ?OTHER MEDICAL CONDITIONS: Hypertension, sleep apnea on CPAP  ? ?REVIEW OF SYSTEMS: Full 14 system review of systems performed and negative with exception of: as noted in the HPI  ? ?ALLERGIES: ?No Known Allergies ? ?HOME MEDICATIONS: ?Outpatient Medications Prior to Visit  ?Medication Sig Dispense Refill  ? aspirin EC 81 MG tablet Take 81 mg by mouth daily. Swallow whole.    ? aspirin-acetaminophen-caffeine (EXCEDRIN MIGRAINE) 250-250-65 MG tablet Take 1 tablet by mouth every 6 (six) hours as needed for headache.    ? furosemide (LASIX) 20 MG tablet TAKE 1 TABLET BY MOUTH AS NEEDED FOR EDEMA. (Patient taking differently: Take 20 mg by mouth daily as needed for fluid or edema.) 90 tablet 3  ? Multiple Vitamin (MULTIVITAMIN) capsule Take 1 capsule by mouth daily.    ? nitroGLYCERIN (NITROSTAT) 0.4 MG SL tablet Place 1 tablet (0.4 mg total) under the tongue every 5 (five) minutes as needed for chest pain. 15 tablet 0  ? Omega 3 1200 MG CAPS Take 2,400 mg by mouth daily.    ? lisinopril (ZESTRIL) 10 MG tablet Take 1 tablet (10 mg total) by mouth daily. 90 tablet 3  ? ?No facility-administered medications prior to visit.  ? ? ?PAST MEDICAL HISTORY: ?Past Medical History:  ?Diagnosis Date  ? CAD (coronary artery disease) 10/30/2019  ? mild-mod by cardiac CT>>med rx  ? HTN (hypertension)   ?  Hyperlipidemia LDL goal <70   ? OSA on CPAP 11/2019  ? SVT (supraventricular tachycardia) (HCC)   ? ? ?PAST SURGICAL HISTORY: ?Past Surgical History:  ?Procedure Laterality Date  ? CARDIAC CATHETERIZATION  10/30/2019  ? mild-mod dz>>med rx  ? VASECTOMY    ? ? ?FAMILY HISTORY: ?Family History  ?Problem Relation Age of Onset  ? Alzheimer's disease Mother   ? Hypertension Mother   ? Diabetes Mother   ? Failure to thrive Father   ? ? ?SOCIAL HISTORY: ?Social History  ? ?Socioeconomic History  ? Marital status: Married  ?  Spouse name: Not on file  ?  Number of children: Not on file  ? Years of education: Not on file  ? Highest education level: Not on file  ?Occupational History  ? Not on file  ?Tobacco Use  ? Smoking status: Every Day  ?  Types: Cigars  ? Smokeless tobacco: Never  ?Substance and Sexual Activity  ? Alcohol use: Never  ? Drug use: Never  ? Sexual activity: Yes  ?Other Topics Concern  ? Not on file  ?Social History Narrative  ? Not on file  ? ?Social Determinants of Health  ? ?Financial Resource Strain: Not on file  ?Food Insecurity: Not on file  ?Transportation Needs: Not on file  ?Physical Activity: Not on file  ?Stress: Not on file  ?Social Connections: Not on file  ?Intimate Partner Violence: Not on file  ? ? ?PHYSICAL EXAM ? ?GENERAL EXAM/CONSTITUTIONAL: ?Vitals:  ?Vitals:  ? 09/23/21 1056  ?BP: (!) 145/83  ?Pulse: (!) 45  ?Weight: (!) 308 lb (139.7 kg)  ?Height: 6\' 4"  (1.93 m)  ? ?Body mass index is 37.49 kg/m?. ?Wt Readings from Last 3 Encounters:  ?09/23/21 (!) 308 lb (139.7 kg)  ?09/20/21 (!) 309 lb 8 oz (140.4 kg)  ?05/23/21 293 lb 12.8 oz (133.3 kg)  ? ?Patient is in no distress; well developed, nourished and groomed; neck is supple ? ?MUSCULOSKELETAL: ?Gait, strength, tone, movements noted in Neurologic exam below ? ?NEUROLOGIC: ?MENTAL STATUS:  ?No flowsheet data found. ?awake, alert, oriented to person, place and time ?recent and remote memory intact ?normal attention and concentration ?language fluent, comprehension intact, naming intact ?fund of knowledge appropriate ? ?CRANIAL NERVE:  ?2nd, 3rd, 4th, 6th - pupils equal and reactive to light, visual fields full to confrontation, extraocular muscles intact, no nystagmus ?5th - facial sensation symmetric ?7th - facial strength symmetric ?8th - hearing intact ?9th - palate elevates symmetrically, uvula midline ?11th - shoulder shrug symmetric ?12th - tongue protrusion midline ? ?MOTOR:  ?normal bulk and tone, full strength in the BUE, BLE ? ?SENSORY:  ?normal and symmetric to light  touch ? ?COORDINATION:  ?finger-nose-finger, fine finger movements normal ? ? ?GAIT/STATION:  ?normal ? ? ? ? ?DIAGNOSTIC DATA (LABS, IMAGING, TESTING) ?- I reviewed patient records, labs, notes, testing and imaging myself where available. ? ?Lab Results  ?Component Value Date  ? WBC 6.1 09/20/2021  ? HGB 13.3 09/20/2021  ? HCT 39.0 09/20/2021  ? MCV 89.9 09/20/2021  ? PLT 190 09/20/2021  ? ?   ?Component Value Date/Time  ? NA 138 09/20/2021 0115  ? K 3.4 (L) 09/20/2021 0115  ? CL 104 09/20/2021 0115  ? CO2 20 (L) 09/20/2021 0110  ? GLUCOSE 90 09/20/2021 0115  ? BUN 17 09/20/2021 0115  ? CREATININE 1.50 (H) 09/20/2021 0115  ? CALCIUM 9.5 09/20/2021 0110  ? PROT 6.4 (L) 09/20/2021 0110  ? ALBUMIN 4.1 09/20/2021  0110  ? AST 44 (H) 09/20/2021 0110  ? ALT 30 09/20/2021 0110  ? ALKPHOS 44 09/20/2021 0110  ? BILITOT 0.7 09/20/2021 0110  ? GFRNONAA >60 09/20/2021 0110  ? GFRAA >60 12/15/2019 2313  ? ?Lab Results  ?Component Value Date  ? CHOL 167 10/31/2019  ? HDL 28 (L) 10/31/2019  ? LDLCALC 102 (H) 10/31/2019  ? TRIG 184 (H) 10/31/2019  ? ?Lab Results  ?Component Value Date  ? HGBA1C 6.0 (H) 10/30/2019  ? ?No results found for: VITAMINB12 ?No results found for: TSH ? ? ?MRI Brain 09/20/2021 ?1. Normal noncontrast MRI appearance of the brain. ?2. Mild to moderate paranasal sinus inflammation. ? ? ? ?ASSESSMENT AND PLAN ? ?51 y.o. year old male  with past medical history of sleep apnea on CPAP and hypertension who is presenting after his first lifetime seizure.  Patient reported the night of, he was drinking, his alcohol level was at 242.  I explained to patient that most likely the seizure was a provoked seizure.  Advised him to reduce his alcohol intake if he cannot currently stop drinking.  I will go ahead and obtain a routine EEG, I will contact the patient to go over the result.  If abnormal might consider repeating the MRI brain with and without contrast.  I have explained to the patient that we will hold antiseizure  medication and continue to observe him.  If he does have a second seizure, at that time we will start antiseizure medication.  Also discussed driving restriction for the next 6 months.  I have provided a letter to his emp

## 2021-09-23 NOTE — Patient Instructions (Signed)
Routine EEG, I will contact you to go over the results.  ?Continue current medications ?Follow-up with your primary care physician ?Driving restriction for the next 6 months, letter provided to his job ?

## 2021-10-02 ENCOUNTER — Ambulatory Visit: Payer: Managed Care, Other (non HMO) | Admitting: Neurology

## 2021-10-02 DIAGNOSIS — G40909 Epilepsy, unspecified, not intractable, without status epilepticus: Secondary | ICD-10-CM

## 2021-10-03 NOTE — Procedures (Signed)
? ? ?  History: ? ?51 year old man with seizure ? ?EEG classification: Awake and drowsy ? ?Description of the recording: The background rhythms of this recording consists of a fairly well modulated medium amplitude alpha rhythm of 12 Hz that is reactive to eye opening and closure. As the record progresses, the patient appears to remain in the waking state throughout the recording. Photic stimulation was performed, did not show any abnormalities. Hyperventilation was also performed, did not show any abnormalities. Toward the end of the recording, the patient enters the drowsy state with slight symmetric slowing seen. The patient never enters stage II sleep. No abnormal epileptiform discharges seen during this recording. There was no focal slowing. EKG monitor shows no evidence of cardiac rhythm abnormalities with a heart rate of 54. ? ?Abnormality: None  ? ?Impression: This is a normal EEG recording in the waking and drowsy state. No evidence of interictal epileptiform discharges seen. A normal EEG does not exclude a diagnosis of epilepsy.  ? ? ?Windell Norfolk, MD ?[facility] ? ? ?

## 2022-02-08 IMAGING — DX DG CHEST 1V PORT
2 series · 2 of 2 positions shown · non-contrast
Comparison: Chest radiograph dated 10/29/2019.

CLINICAL DATA: 48-year-old male with shortness of breath

EXAM:
PORTABLE CHEST 1 VIEW

[chest ap (1 of 2)]
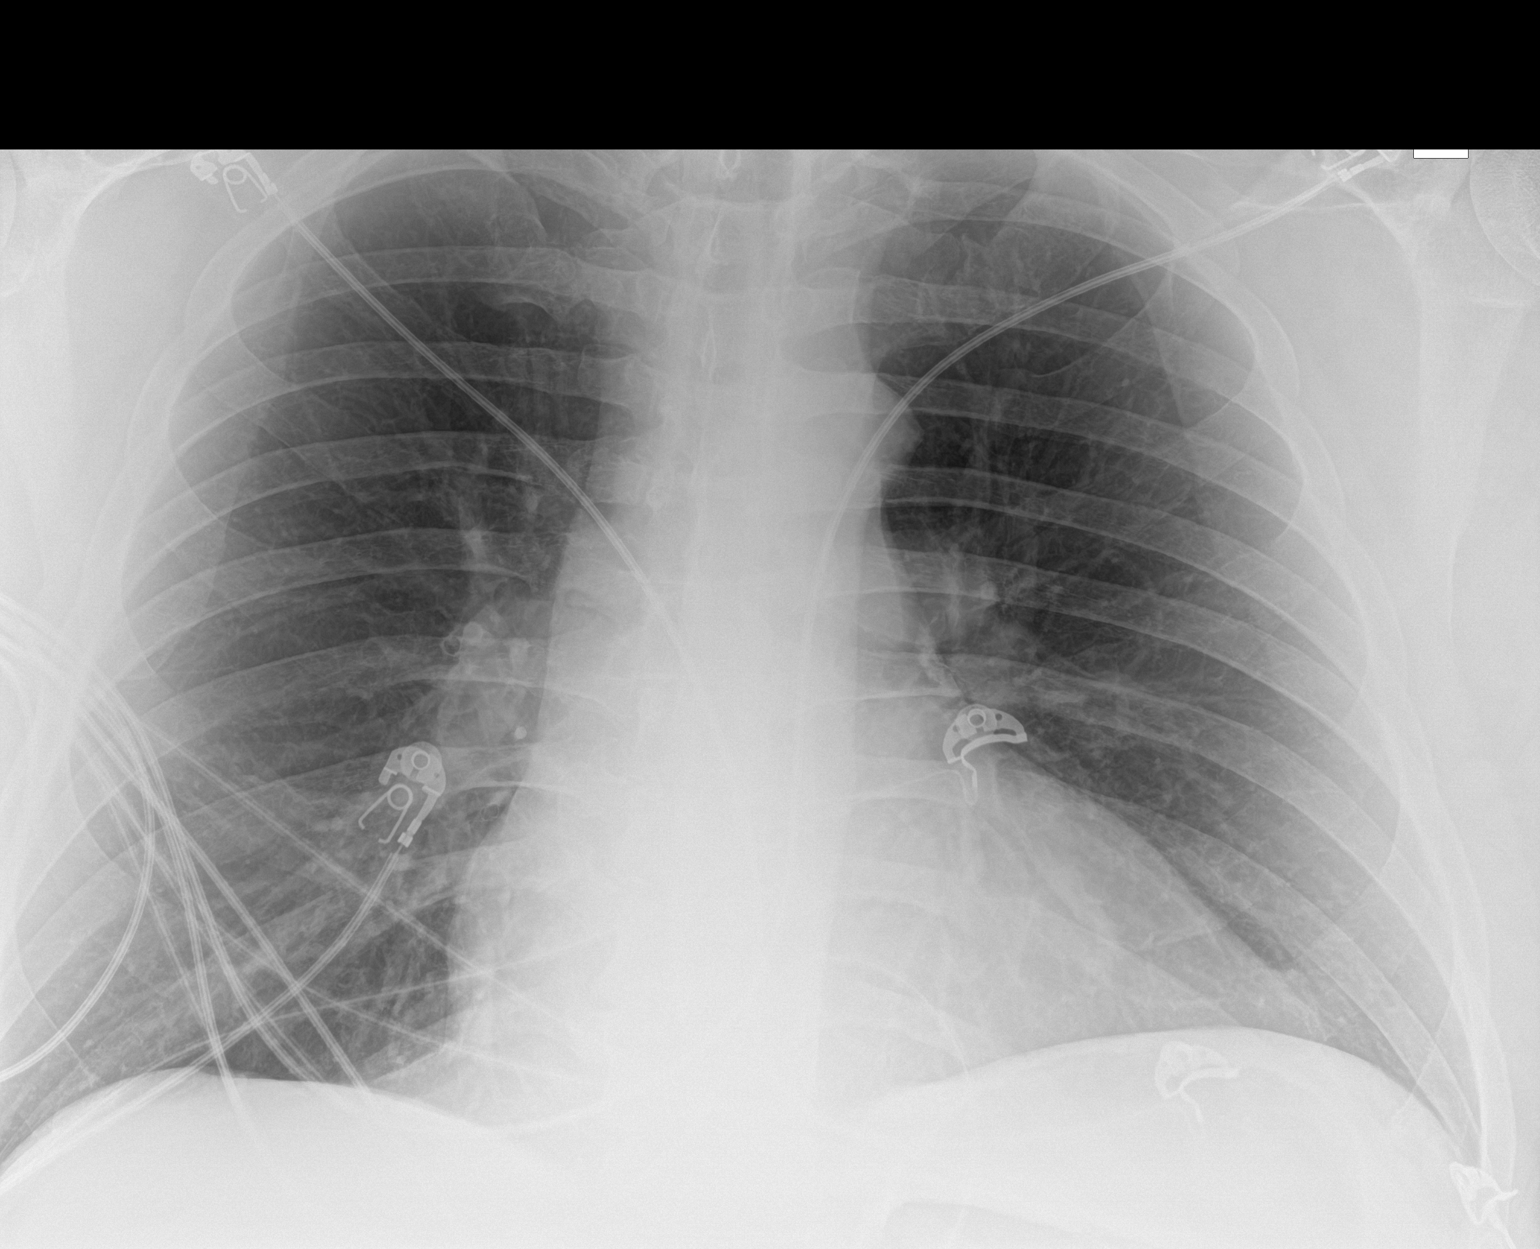

[chest ap (2 of 2)]
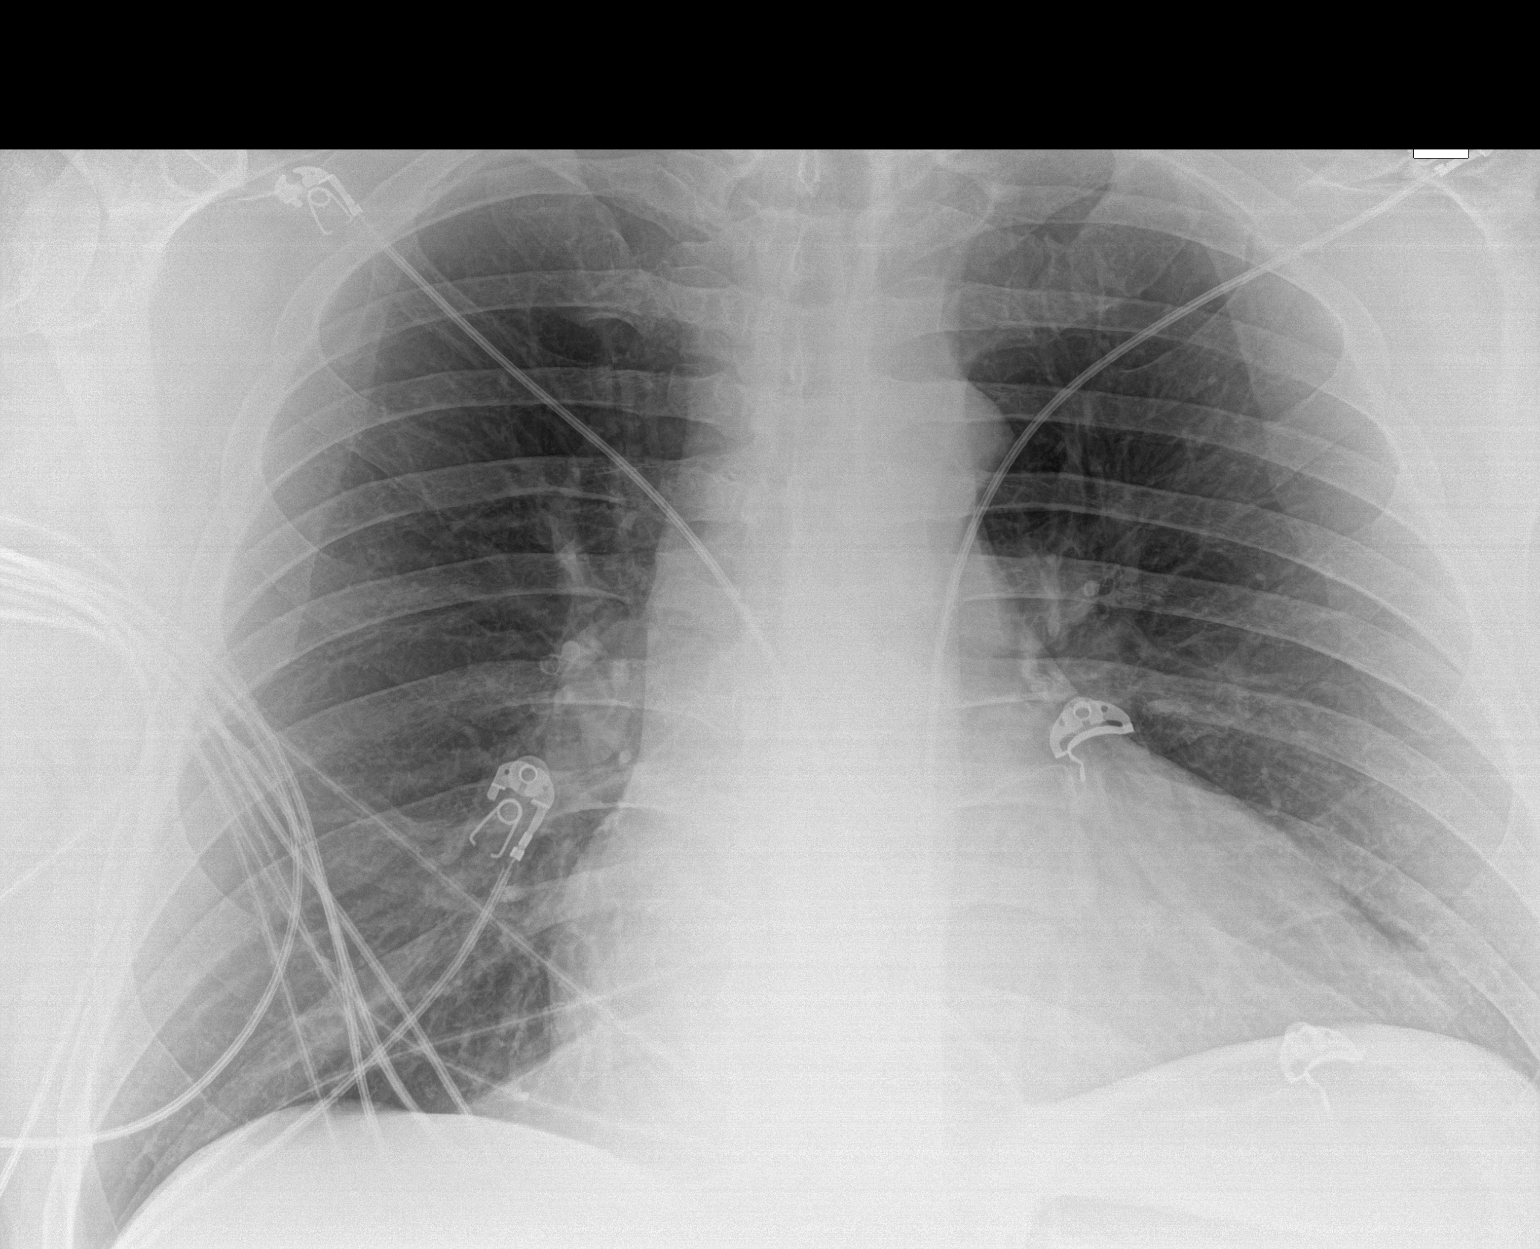

[2 of 2 positions shown; findings below may reference images not displayed]

FINDINGS: The lungs are clear. There is no pleural effusion or pneumothorax.
Mild cardiomegaly. No acute osseous pathology.
IMPRESSION: 1. No acute cardiopulmonary process.
2. Mild cardiomegaly.

## 2022-03-10 ENCOUNTER — Encounter: Payer: Self-pay | Admitting: Neurology

## 2022-03-14 IMAGING — CR DG CHEST 2V
4 series · 4 of 4 positions shown · non-contrast
Comparison: 11/11/2019

CLINICAL DATA: Chest pain

EXAM:
CHEST - 2 VIEW

[chest pa (1 of 2)]
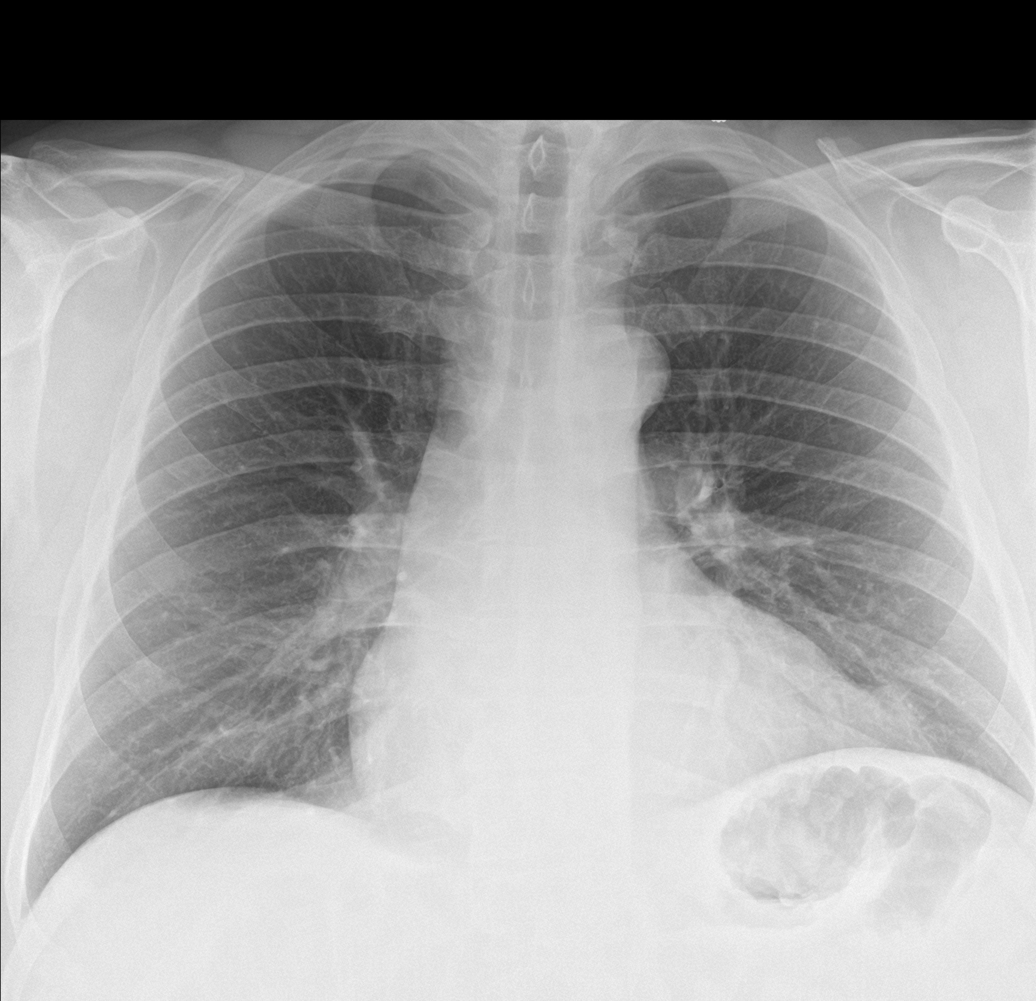

[chest lat (1 of 2)]
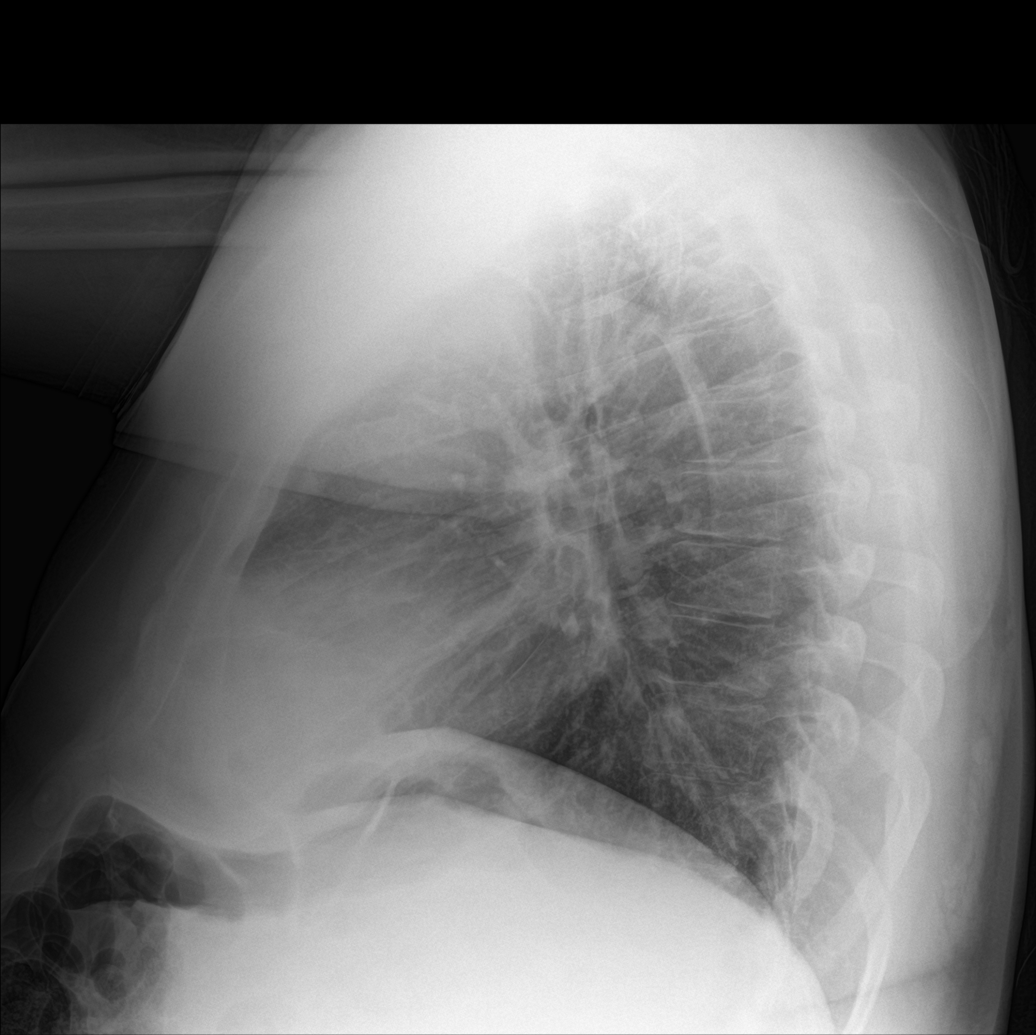

[chest pa (2 of 2)]
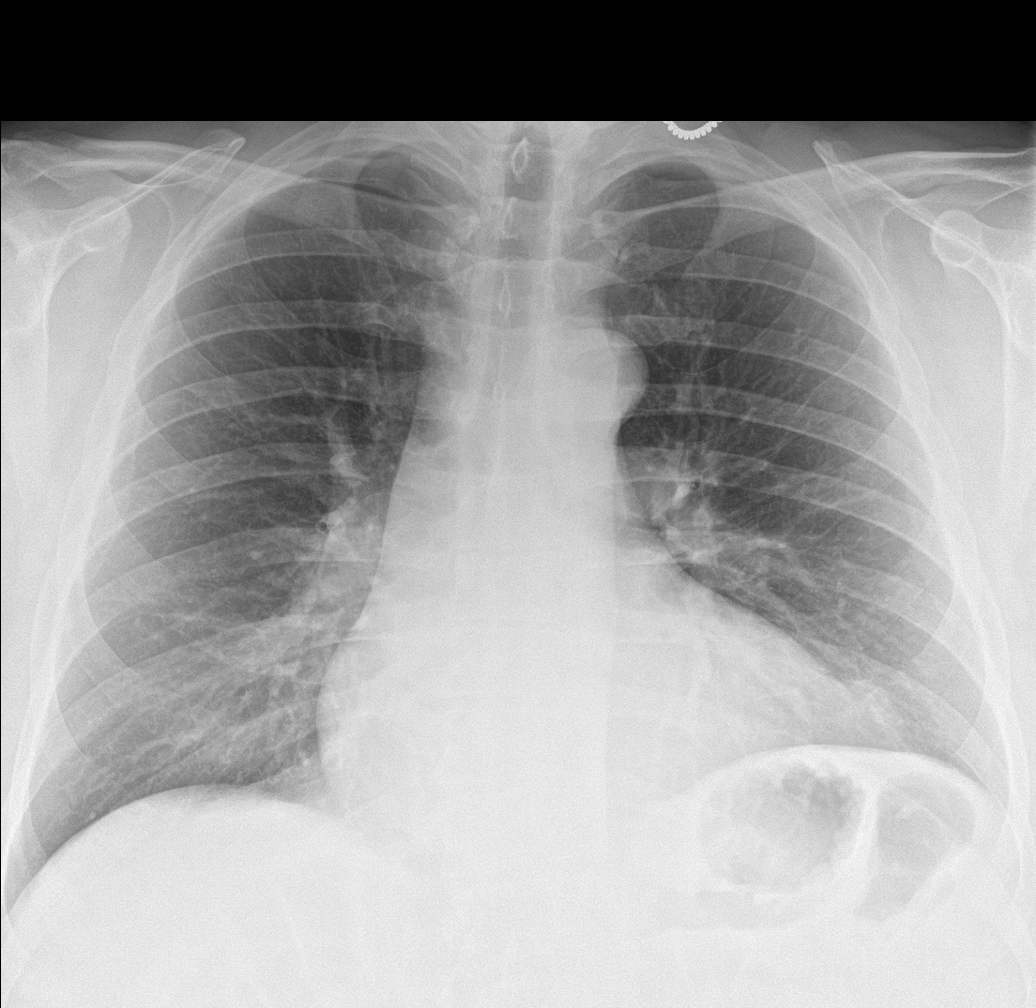

[chest lat (2 of 2)]
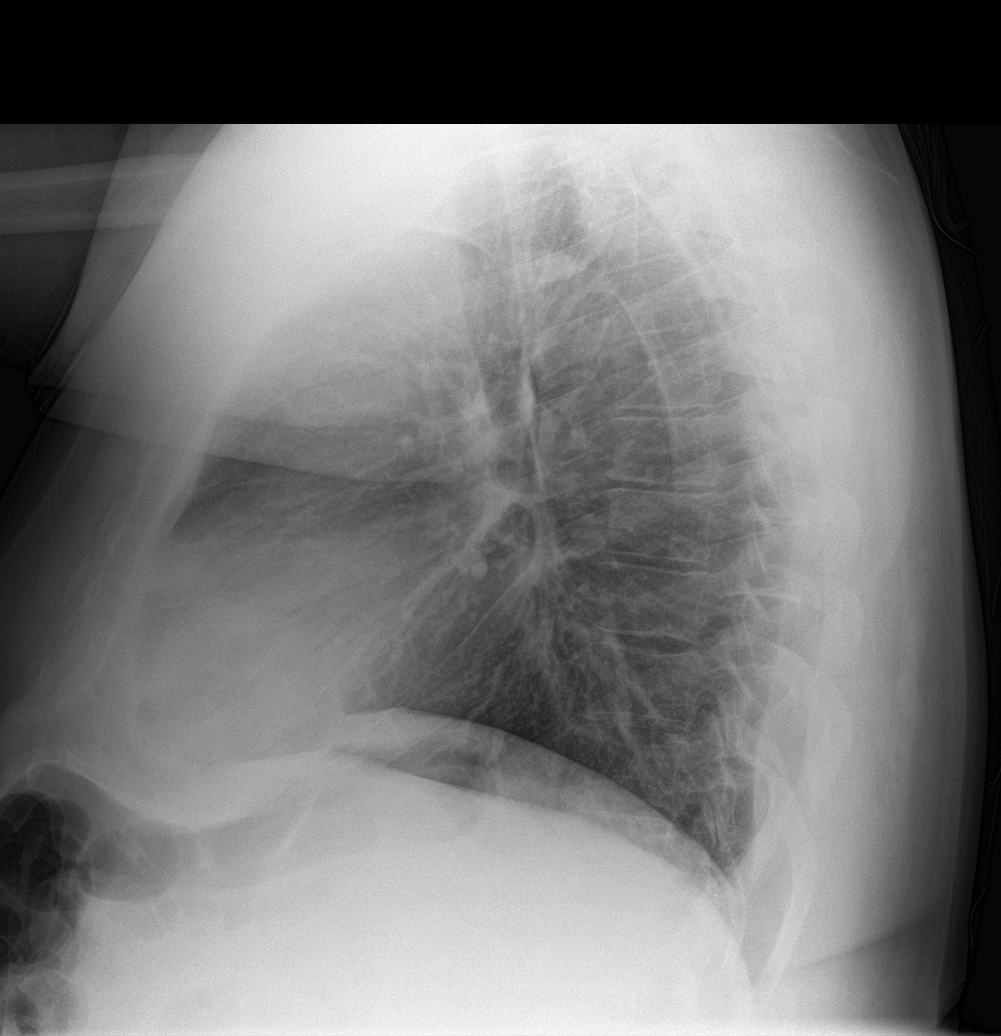

[4 of 4 positions shown; findings below may reference images not displayed]

FINDINGS: Heart and mediastinal contours are within normal limits. No focal
opacities or effusions. No acute bony abnormality.
IMPRESSION: No active cardiopulmonary disease.

## 2022-04-07 ENCOUNTER — Encounter: Payer: Self-pay | Admitting: Neurology

## 2022-04-07 ENCOUNTER — Ambulatory Visit: Payer: Managed Care, Other (non HMO) | Admitting: Neurology

## 2022-04-07 VITALS — BP 147/86 | HR 50 | Ht 76.0 in | Wt 323.0 lb

## 2022-04-07 DIAGNOSIS — G40909 Epilepsy, unspecified, not intractable, without status epilepticus: Secondary | ICD-10-CM | POA: Diagnosis not present

## 2022-04-07 NOTE — Progress Notes (Signed)
[facility]  PATIENT: Edward Obrien DOB: 1971/04/27  REQUESTING CLINICIAN: Eartha Inch, MD HISTORY FROM: Patient and spouse  REASON FOR VISIT: Seizure    HISTORICAL  CHIEF COMPLAINT:  Chief Complaint  Patient presents with   Follow-up    Rm 12 here for 6 month f/u.  Reports he has been doing well no seizures. No compliant/concerns .      INTERVAL HISTORY 04/07/22:  Patient presented for follow-up, last visit was 6 months ago at that time plan was to not start him on medication because it was a provoked single seizure.  Since then he has been doing well no additional seizures no events and no concerns.   HISTORY OF PRESENT ILLNESS:  This is a 51 year old gentleman past medical history of hypertension, sleep apnea on CPAP who is presenting after first lifetime seizure.  Patient stated that he remembers being in the kitchen with wife and daughter and next thing that he remembers, he is on the floor talking gibberish.  He states that he remembers when paramedic arrived to his house and remembers the ride to the hospital.  In the ED he was back to his normal self.  He did have a brain MRI which did not show any acute abnormality, EEG was not performed. Wife reported patient was standing then arched his back then fell to the ground and did have generalized tonic-clonic activity lasted about 40 seconds.  After which he appeared confused, was talking gibberish.  Wife helped him to the chair and he waited paramedics arrival.  He denies any previous history of seizures or seizure-like activity.  He states that night he did have 3 glasses of mixed drink (vodka) and usually drink 3-4 times per week.  His alcohol level was 242. Since leaving the hospital he is back to his normal self, denies any additional seizure-like event.  He was not started on any medication.  Currently he is not driving.   Handedness: Right handed   Onset: September 20, 2021  Seizure Type: Unclear,  likely generalized  Current frequency: Only once  Any injuries from seizures: bruises   Seizure risk factors: Maternal grandfather   Previous ASMs: None  Currenty ASMs: None  ASMs side effects: Not applicable  Brain Images: No acute abnormality on brain MRI  Previous EEGs: None   OTHER MEDICAL CONDITIONS: Hypertension, sleep apnea on CPAP   REVIEW OF SYSTEMS: Full 14 system review of systems performed and negative with exception of: as noted in the HPI   ALLERGIES: No Known Allergies  HOME MEDICATIONS: Outpatient Medications Prior to Visit  Medication Sig Dispense Refill   aspirin EC 81 MG tablet Take 81 mg by mouth daily. Swallow whole.     aspirin-acetaminophen-caffeine (EXCEDRIN MIGRAINE) 250-250-65 MG tablet Take 1 tablet by mouth every 6 (six) hours as needed for headache.     furosemide (LASIX) 20 MG tablet TAKE 1 TABLET BY MOUTH AS NEEDED FOR EDEMA. (Patient taking differently: Take 20 mg by mouth daily as needed for fluid or edema.) 90 tablet 3   Multiple Vitamin (MULTIVITAMIN) capsule Take 1 capsule by mouth daily.     nitroGLYCERIN (NITROSTAT) 0.4 MG SL tablet Place 1 tablet (0.4 mg total) under the tongue every 5 (five) minutes as needed for chest pain. 15 tablet 0   Omega 3 1200 MG CAPS Take 2,400 mg by mouth daily.     lisinopril (ZESTRIL) 10 MG tablet Take 1 tablet (10 mg total) by mouth daily. 90  tablet 3   No facility-administered medications prior to visit.    PAST MEDICAL HISTORY: Past Medical History:  Diagnosis Date   CAD (coronary artery disease) 10/30/2019   mild-mod by cardiac CT>>med rx   HTN (hypertension)    Hyperlipidemia LDL goal <70    OSA on CPAP 11/2019   SVT (supraventricular tachycardia)     PAST SURGICAL HISTORY: Past Surgical History:  Procedure Laterality Date   CARDIAC CATHETERIZATION  10/30/2019   mild-mod dz>>med rx   VASECTOMY      FAMILY HISTORY: Family History  Problem Relation Age of Onset   Alzheimer's disease  Mother    Hypertension Mother    Diabetes Mother    Failure to thrive Father     SOCIAL HISTORY: Social History   Socioeconomic History   Marital status: Married    Spouse name: Not on file   Number of children: Not on file   Years of education: Not on file   Highest education level: Not on file  Occupational History   Not on file  Tobacco Use   Smoking status: Every Day    Types: Cigars   Smokeless tobacco: Never  Substance and Sexual Activity   Alcohol use: Never   Drug use: Never   Sexual activity: Yes  Other Topics Concern   Not on file  Social History Narrative   Not on file   Social Determinants of Health   Financial Resource Strain: Not on file  Food Insecurity: Not on file  Transportation Needs: Not on file  Physical Activity: Not on file  Stress: Not on file  Social Connections: Not on file  Intimate Partner Violence: Not on file    PHYSICAL EXAM  GENERAL EXAM/CONSTITUTIONAL: Vitals:  Vitals:   04/07/22 0859  BP: (!) 147/86  Pulse: (!) 50  Weight: (!) 323 lb (146.5 kg)  Height: 6\' 4"  (1.93 m)    Body mass index is 39.32 kg/m. Wt Readings from Last 3 Encounters:  04/07/22 (!) 323 lb (146.5 kg)  09/23/21 (!) 308 lb (139.7 kg)  09/20/21 (!) 309 lb 8 oz (140.4 kg)   Patient is in no distress; well developed, nourished and groomed; neck is supple  MUSCULOSKELETAL: Gait, strength, tone, movements noted in Neurologic exam below  NEUROLOGIC: MENTAL STATUS:      No data to display         awake, alert, oriented to person, place and time recent and remote memory intact normal attention and concentration language fluent, comprehension intact, naming intact fund of knowledge appropriate  CRANIAL NERVE:  2nd, 3rd, 4th, 6th - pupils equal and reactive to light, visual fields full to confrontation, extraocular muscles intact, no nystagmus 5th - facial sensation symmetric 7th - facial strength symmetric 8th - hearing intact 9th - palate  elevates symmetrically, uvula midline 11th - shoulder shrug symmetric 12th - tongue protrusion midline  MOTOR:  normal bulk and tone, full strength in the BUE, BLE  SENSORY:  normal and symmetric to light touch  COORDINATION:  finger-nose-finger, fine finger movements normal   GAIT/STATION:  normal     DIAGNOSTIC DATA (LABS, IMAGING, TESTING) - I reviewed patient records, labs, notes, testing and imaging myself where available.  Lab Results  Component Value Date   WBC 6.1 09/20/2021   HGB 13.3 09/20/2021   HCT 39.0 09/20/2021   MCV 89.9 09/20/2021   PLT 190 09/20/2021      Component Value Date/Time   NA 138 09/20/2021 0115   K  3.4 (L) 09/20/2021 0115   CL 104 09/20/2021 0115   CO2 20 (L) 09/20/2021 0110   GLUCOSE 90 09/20/2021 0115   BUN 17 09/20/2021 0115   CREATININE 1.50 (H) 09/20/2021 0115   CALCIUM 9.5 09/20/2021 0110   PROT 6.4 (L) 09/20/2021 0110   ALBUMIN 4.1 09/20/2021 0110   AST 44 (H) 09/20/2021 0110   ALT 30 09/20/2021 0110   ALKPHOS 44 09/20/2021 0110   BILITOT 0.7 09/20/2021 0110   GFRNONAA >60 09/20/2021 0110   GFRAA >60 12/15/2019 2313   Lab Results  Component Value Date   CHOL 167 10/31/2019   HDL 28 (L) 10/31/2019   LDLCALC 102 (H) 10/31/2019   TRIG 184 (H) 10/31/2019   Lab Results  Component Value Date   HGBA1C 6.0 (H) 10/30/2019   No results found for: "VITAMINB12" No results found for: "TSH"   MRI Brain 09/20/2021 1. Normal noncontrast MRI appearance of the brain. 2. Mild to moderate paranasal sinus inflammation.   EEG 10/02/21: Normal    ASSESSMENT AND PLAN  51 y.o. year old male  with past medical history of sleep apnea on CPAP and hypertension who is presenting for seizure follow up.  No seizures since last visit, overall doing well.  His examination is nonfocal.  He has been seizure-free for the past 80-months, therefore he may resume driving.  Continue to follow with PCP and return as needed.    1. Seizure disorder  [facility])      Patient Instructions  Continue current medications You may resume driving Continue to follow with PCP Return as needed   Per [facility] statutes, patients with seizures are not allowed to drive until they have been seizure-free for six months.  Other recommendations include using caution when using heavy equipment or power tools. Avoid working on ladders or at heights. Take showers instead of baths.  Do not swim alone.  Ensure the water temperature is not too high on the home water heater. Do not go swimming alone. Do not lock yourself in a room alone (i.e. bathroom). When caring for infants or small children, sit down when holding, feeding, or changing them to minimize risk of injury to the child in the event you have a seizure. Maintain good sleep hygiene. Avoid alcohol.  Also recommend adequate sleep, hydration, good diet and minimize stress.   During the Seizure  - First, ensure adequate ventilation and place patients on the floor on their left side  Loosen clothing around the neck and ensure the airway is patent. If the patient is clenching the teeth, do not force the mouth open with any object as this can cause severe damage - Remove all items from the surrounding that can be hazardous. The patient may be oblivious to what's happening and may not even know what he or she is doing. If the patient is confused and wandering, either gently guide him/her away and block access to outside areas - Reassure the individual and be comforting - Call 911. In most cases, the seizure ends before EMS arrives. However, there are cases when seizures may last over 3 to 5 minutes. Or the individual may have developed breathing difficulties or severe injuries. If a pregnant patient or a person with diabetes develops a seizure, it is prudent to call an ambulance. - Finally, if the patient does not regain full consciousness, then call EMS. Most patients will remain confused for about 45 to 90  minutes after a seizure, so you must use  judgment in calling for help. - Avoid restraints but make sure the patient is in a bed with padded side rails - Place the individual in a lateral position with the neck slightly flexed; this will help the saliva drain from the mouth and prevent the tongue from falling backward - Remove all nearby furniture and other hazards from the area - Provide verbal assurance as the individual is regaining consciousness - Provide the patient with privacy if possible - Call for help and start treatment as ordered by the caregiver   After the Seizure (Postictal Stage)  After a seizure, most patients experience confusion, fatigue, muscle pain and/or a headache. Thus, one should permit the individual to sleep. For the next few days, reassurance is essential. Being calm and helping reorient the person is also of importance.  Most seizures are painless and end spontaneously. Seizures are not harmful to others but can lead to complications such as stress on the lungs, brain and the heart. Individuals with prior lung problems may develop labored breathing and respiratory distress.     No orders of the defined types were placed in this encounter.   No orders of the defined types were placed in this encounter.   Return if symptoms worsen or fail to improve.   Windell Norfolk, MD 04/07/2022, 9:19 AM  [facility] [address] [place], [place] [address] [phone]

## 2022-04-07 NOTE — Patient Instructions (Signed)
Continue current medications You may resume driving Continue to follow with PCP Return as needed

## 2022-05-24 ENCOUNTER — Other Ambulatory Visit: Payer: Self-pay | Admitting: Cardiovascular Disease

## 2022-08-31 ENCOUNTER — Other Ambulatory Visit: Payer: Self-pay | Admitting: Cardiovascular Disease

## 2022-09-29 ENCOUNTER — Other Ambulatory Visit: Payer: Self-pay | Admitting: *Deleted

## 2022-09-29 MED ORDER — LISINOPRIL 10 MG PO TABS: 10.0000 mg | ORAL_TABLET | Freq: Every day | ORAL | 0 refills | Status: DC

## 2022-10-05 ENCOUNTER — Ambulatory Visit: Payer: Managed Care, Other (non HMO) | Admitting: Neurology

## 2022-11-04 DIAGNOSIS — Z1330 Encounter for screening examination for mental health and behavioral disorders, unspecified: Secondary | ICD-10-CM | POA: Diagnosis not present

## 2022-11-04 DIAGNOSIS — R350 Frequency of micturition: Secondary | ICD-10-CM | POA: Diagnosis not present

## 2022-11-04 DIAGNOSIS — I2510 Atherosclerotic heart disease of native coronary artery without angina pectoris: Secondary | ICD-10-CM | POA: Diagnosis not present

## 2022-11-04 DIAGNOSIS — E039 Hypothyroidism, unspecified: Secondary | ICD-10-CM | POA: Diagnosis not present

## 2022-11-04 DIAGNOSIS — I471 Supraventricular tachycardia, unspecified: Secondary | ICD-10-CM | POA: Diagnosis not present

## 2022-11-04 DIAGNOSIS — R739 Hyperglycemia, unspecified: Secondary | ICD-10-CM | POA: Diagnosis not present

## 2022-11-04 DIAGNOSIS — I10 Essential (primary) hypertension: Secondary | ICD-10-CM | POA: Diagnosis not present

## 2022-11-04 DIAGNOSIS — Z0000 Encounter for general adult medical examination without abnormal findings: Secondary | ICD-10-CM | POA: Diagnosis not present

## 2022-11-04 DIAGNOSIS — Z13220 Encounter for screening for lipoid disorders: Secondary | ICD-10-CM | POA: Diagnosis not present

## 2022-11-10 DIAGNOSIS — M4124 Other idiopathic scoliosis, thoracic region: Secondary | ICD-10-CM | POA: Diagnosis not present

## 2022-11-10 DIAGNOSIS — M9902 Segmental and somatic dysfunction of thoracic region: Secondary | ICD-10-CM | POA: Diagnosis not present

## 2022-11-10 DIAGNOSIS — M5136 Other intervertebral disc degeneration, lumbar region: Secondary | ICD-10-CM | POA: Diagnosis not present

## 2022-11-10 DIAGNOSIS — M5134 Other intervertebral disc degeneration, thoracic region: Secondary | ICD-10-CM | POA: Diagnosis not present

## 2022-11-15 ENCOUNTER — Other Ambulatory Visit: Payer: Self-pay

## 2022-11-15 ENCOUNTER — Observation Stay (HOSPITAL_COMMUNITY)
Admission: EM | Admit: 2022-11-15 | Discharge: 2022-11-16 | Disposition: A | Discharge status: Home / Self Care | Payer: BC Managed Care – PPO | Attending: Internal Medicine | Admitting: Internal Medicine

## 2022-11-15 ENCOUNTER — Encounter (HOSPITAL_COMMUNITY): Payer: Self-pay | Admitting: Internal Medicine

## 2022-11-15 ENCOUNTER — Emergency Department (HOSPITAL_COMMUNITY): Payer: BC Managed Care – PPO

## 2022-11-15 DIAGNOSIS — I10 Essential (primary) hypertension: Secondary | ICD-10-CM | POA: Insufficient documentation

## 2022-11-15 DIAGNOSIS — I471 Supraventricular tachycardia, unspecified: Secondary | ICD-10-CM | POA: Diagnosis not present

## 2022-11-15 DIAGNOSIS — F17290 Nicotine dependence, other tobacco product, uncomplicated: Secondary | ICD-10-CM | POA: Diagnosis not present

## 2022-11-15 DIAGNOSIS — I2510 Atherosclerotic heart disease of native coronary artery without angina pectoris: Secondary | ICD-10-CM | POA: Insufficient documentation

## 2022-11-15 DIAGNOSIS — I480 Paroxysmal atrial fibrillation: Secondary | ICD-10-CM

## 2022-11-15 DIAGNOSIS — Z7982 Long term (current) use of aspirin: Secondary | ICD-10-CM | POA: Insufficient documentation

## 2022-11-15 DIAGNOSIS — I4891 Unspecified atrial fibrillation: Secondary | ICD-10-CM | POA: Diagnosis not present

## 2022-11-15 DIAGNOSIS — E785 Hyperlipidemia, unspecified: Secondary | ICD-10-CM

## 2022-11-15 DIAGNOSIS — Z79899 Other long term (current) drug therapy: Secondary | ICD-10-CM | POA: Insufficient documentation

## 2022-11-15 LAB — BASIC METABOLIC PANEL
Anion gap: 13 (ref 5–15)
BUN: 16 mg/dL (ref 6–20)
CO2: 19 mmol/L — ABNORMAL LOW (ref 22–32)
Calcium: 8.9 mg/dL (ref 8.9–10.3)
Chloride: 107 mmol/L (ref 98–111)
Creatinine, Ser: 1.08 mg/dL (ref 0.61–1.24)
GFR, Estimated: >60 mL/min (ref >60)
Glucose, Bld: 92 mg/dL (ref 70–99)
Potassium: 4 mmol/L (ref 3.5–5.1)
Sodium: 139 mmol/L (ref 135–145)

## 2022-11-15 LAB — CBC
HCT: 42.4 % (ref 39.0–52.0)
Hemoglobin: 14.6 g/dL (ref 13.0–17.0)
MCH: 31.3 pg (ref 26.0–34.0)
MCHC: 34.4 g/dL (ref 30.0–36.0)
MCV: 90.8 fL (ref 80.0–100.0)
Platelets: 229 10*3/uL (ref 150–400)
RBC: 4.67 MIL/uL (ref 4.22–5.81)
RDW: 12.1 % (ref 11.5–15.5)
WBC: 7.9 10*3/uL (ref 4.0–10.5)
nRBC: 0 % (ref 0.0–0.2)

## 2022-11-15 LAB — TSH: TSH: 1.948 u[IU]/mL (ref 0.350–4.500)

## 2022-11-15 LAB — TROPONIN I (HIGH SENSITIVITY)
Troponin I (High Sensitivity): 14 ng/L (ref <18)
Troponin I (High Sensitivity): 12 ng/L (ref <18)

## 2022-11-15 MED ORDER — OMEGA 3 1200 MG PO CAPS: 2400.0000 mg | ORAL_CAPSULE | Freq: Every day | ORAL | Status: DC

## 2022-11-15 MED ORDER — LORAZEPAM 1 MG PO TABS: 0.0000 mg | ORAL_TABLET | Freq: Two times a day (BID) | ORAL | Status: DC

## 2022-11-15 MED ORDER — ONDANSETRON HCL 4 MG/2ML IJ SOLN: 4.0000 mg | Freq: Four times a day (QID) | INTRAMUSCULAR | Status: DC | PRN

## 2022-11-15 MED ORDER — ADULT MULTIVITAMIN W/MINERALS CH
1.0000 | ORAL_TABLET | Freq: Every day | ORAL | Status: DC
  Administered 2022-11-15 – 2022-11-16 (×2): 1 via ORAL
  Filled 2022-11-15 (×2): qty 1

## 2022-11-15 MED ORDER — THIAMINE MONONITRATE 100 MG PO TABS
100.0000 mg | ORAL_TABLET | Freq: Every day | ORAL | Status: DC
  Administered 2022-11-15 – 2022-11-16 (×2): 100 mg via ORAL
  Filled 2022-11-15 (×2): qty 1

## 2022-11-15 MED ORDER — ACETAMINOPHEN 325 MG PO TABS: 650.0000 mg | ORAL_TABLET | ORAL | Status: DC | PRN

## 2022-11-15 MED ORDER — HEPARIN BOLUS VIA INFUSION
5000.0000 [IU] | Freq: Once | INTRAVENOUS | Status: AC
  Administered 2022-11-15: 5000 [IU] via INTRAVENOUS
  Filled 2022-11-15: qty 5000

## 2022-11-15 MED ORDER — LORAZEPAM 1 MG PO TABS: 0.0000 mg | ORAL_TABLET | Freq: Four times a day (QID) | ORAL | Status: DC

## 2022-11-15 MED ORDER — DILTIAZEM HCL-DEXTROSE 125-5 MG/125ML-% IV SOLN (PREMIX)
5.0000 mg/h | INTRAVENOUS | Status: DC
  Administered 2022-11-15: 5 mg/h via INTRAVENOUS
  Administered 2022-11-15 – 2022-11-16 (×2): 15 mg/h via INTRAVENOUS
  Filled 2022-11-15 (×3): qty 125

## 2022-11-15 MED ORDER — HEPARIN (PORCINE) 25000 UT/250ML-% IV SOLN
1900.0000 [IU]/h | INTRAVENOUS | Status: DC
  Administered 2022-11-15: 1900 [IU]/h via INTRAVENOUS
  Filled 2022-11-15: qty 250

## 2022-11-15 MED ORDER — LORAZEPAM 1 MG PO TABS: 1.0000 mg | ORAL_TABLET | ORAL | Status: DC | PRN

## 2022-11-15 MED ORDER — FOLIC ACID 1 MG PO TABS
1.0000 mg | ORAL_TABLET | Freq: Every day | ORAL | Status: DC
  Administered 2022-11-15 – 2022-11-16 (×2): 1 mg via ORAL
  Filled 2022-11-15 (×2): qty 1

## 2022-11-15 MED ORDER — APIXABAN 5 MG PO TABS
5.0000 mg | ORAL_TABLET | Freq: Two times a day (BID) | ORAL | Status: DC
  Administered 2022-11-15 – 2022-11-16 (×3): 5 mg via ORAL
  Filled 2022-11-15 (×3): qty 1

## 2022-11-15 MED ORDER — DILTIAZEM LOAD VIA INFUSION
10.0000 mg | Freq: Once | INTRAVENOUS | Status: AC
  Administered 2022-11-15: 10 mg via INTRAVENOUS
  Filled 2022-11-15: qty 10

## 2022-11-15 MED ORDER — SODIUM CHLORIDE 0.9% FLUSH: 3.0000 mL | INTRAVENOUS | Status: DC | PRN

## 2022-11-15 MED ORDER — THIAMINE HCL 100 MG/ML IJ SOLN: 100.0000 mg | Freq: Every day | INTRAMUSCULAR | Status: DC

## 2022-11-15 MED ORDER — SODIUM CHLORIDE 0.9% FLUSH
3.0000 mL | Freq: Two times a day (BID) | INTRAVENOUS | Status: DC
  Administered 2022-11-15 – 2022-11-16 (×2): 3 mL via INTRAVENOUS

## 2022-11-15 MED ORDER — SODIUM CHLORIDE 0.9 % IV SOLN: 250.0000 mL | INTRAVENOUS | Status: DC | PRN

## 2022-11-15 NOTE — Progress Notes (Signed)
ANTICOAGULATION CONSULT NOTE  Pharmacy Consult for Apixaban (transition from UFH) Indication: atrial fibrillation  No Known Allergies  Patient Measurements: Height: 6\' 3"  (190.5 cm) Weight: (!) 153.6 kg (338 lb 10 oz) IBW/kg (Calculated) : 84.5 Heparin Dosing Weight: 120 kg  Vital Signs: Temp: 99.1 F (37.3 C) (05/12 1032) Temp Source: Oral (05/12 1032) BP: 123/70 (05/12 1000) Pulse Rate: 119 (05/12 1032)  Labs: Recent Labs    11/15/22 0600 11/15/22 0835  HGB 14.6  --   HCT 42.4  --   PLT 229  --   CREATININE 1.08  --   TROPONINIHS 14 12    Estimated Creatinine Clearance: 125.4 mL/min (by C-G formula based on SCr of 1.08 mg/dL).   Medical History: Past Medical History:  Diagnosis Date   CAD (coronary artery disease) 10/30/2019   mild-mod by cardiac CT>>med rx   HTN (hypertension)    Hyperlipidemia LDL goal <70    OSA on CPAP 11/2019   SVT (supraventricular tachycardia)     Medications:  (Not in a hospital admission)  Scheduled:  Infusions:   diltiazem (CARDIZEM) infusion 15 mg/hr (11/15/22 1032)   heparin 1,900 Units/hr (11/15/22 0841)   PRN:   Assessment: 51 yom without known h/o AF. Patient is presenting with SOB & AF into 140s. Per patient normal resting HR is 40s. Apixaban per pharmacy consult placed for atrial fibrillation.  Patient was not on anticoagulation prior to arrival. Started on 1900 units/hr IV heparin following 5000 unit IV heparin bolus.   Hgb 14.6; plt 229  Goal of Therapy:  Heparin level 0.3-0.7 units/ml Monitor platelets by anticoagulation protocol: Yes   Plan:  STOP Heparin infusion now START apixaban 5 mg BID Continue to monitor H&H and platelets Will need DOAC education prior to discharge  Delmar Landau, PharmD, BCPS 11/15/2022 10:58 AM ED Clinical Pharmacist -  [phone]

## 2022-11-15 NOTE — ED Notes (Signed)
ED TO INPATIENT HANDOFF REPORT  ED Nurse Name and Phone #: Brett Canales [phone]  S Name/Age/Gender Edward Obrien 52 y.o. male Room/Bed: 034C/034C  Code Status   Code Status: Full Code  Home/SNF/Other Home Patient oriented to: self, place, time, and situation Is this baseline? Yes   Triage Complete: Triage complete  Chief Complaint Atrial fibrillation with RVR (HCC) [I48.91]  Triage Note Pt presents for SOB and having been awoken by his apple watch notifying him that he is in afib at a rate of 140 since 0240 this AM.  Denies h/o afib Denies nausea.  Pt has h/o bradycardia which he states PCP was not concerned about, states his ususal resting is in 40s. Not on a beta blocker.    Allergies No Known Allergies  Level of Care/Admitting Diagnosis ED Disposition     ED Disposition  Admit   Condition  --   Comment  Hospital Area: [facility] [[id number]]  Level of Care: Progressive [102]  Admit to Progressive based on following criteria: CARDIOVASCULAR & THORACIC of moderate stability with acute coronary syndrome symptoms/low risk myocardial infarction/hypertensive urgency/arrhythmias/heart failure potentially compromising stability and stable post cardiovascular intervention patients.  May place patient in observation at [facility] or [facility] if equivalent level of care is available:: No  Covid Evaluation: Asymptomatic - no recent exposure (last 10 days) testing not required  Diagnosis: Atrial fibrillation with RVR [facility]) [[id number]]  Admitting Physician: Abbey Chatters  Attending Physician: Abbey Chatters          B Medical/Surgery History Past Medical History:  Diagnosis Date   CAD (coronary artery disease) 10/30/2019   mild-mod by cardiac CT>>med rx   HTN (hypertension)    Hyperlipidemia LDL goal <70    OSA on CPAP 11/2019   SVT (supraventricular tachycardia)    Past Surgical History:  Procedure Laterality Date   CARDIAC  CATHETERIZATION  10/30/2019   mild-mod dz>>med rx   VASECTOMY       A IV Location/Drains/Wounds Patient Lines/Drains/Airways Status     Active Line/Drains/Airways     Name Placement date Placement time Site Days   Peripheral IV 11/15/22 20 G Anterior;Proximal;Right Forearm 11/15/22  0637  Forearm  less than 1   Peripheral IV 11/15/22 20 G Posterior;Right Hand 11/15/22  0834  Hand  less than 1            Intake/Output Last 24 hours No intake or output data in the 24 hours ending 11/15/22 1138  Labs/Imaging Results for orders placed or performed during the hospital encounter of 11/15/22 (from the past 48 hour(s))  Basic metabolic panel     Status: Abnormal   Collection Time: 11/15/22  6:00 AM  Result Value Ref Range   Sodium 139 135 - 145 mmol/L   Potassium 4.0 3.5 - 5.1 mmol/L   Chloride 107 98 - 111 mmol/L   CO2 19 (L) 22 - 32 mmol/L   Glucose, Bld 92 70 - 99 mg/dL    Comment: Glucose reference range applies only to samples taken after fasting for at least 8 hours.   BUN 16 6 - 20 mg/dL   Creatinine, Ser 6.21 0.61 - 1.24 mg/dL   Calcium 8.9 8.9 - 30.8 mg/dL   GFR, Estimated >65 >78 mL/min    Comment: (NOTE) Calculated using the CKD-EPI Creatinine Equation (2021)    Anion gap 13 5 - 15    Comment: Performed at [facility], [address], [place], [place] [address]  CBC     Status: None   Collection Time: 11/15/22  6:00 AM  Result Value Ref Range   WBC 7.9 4.0 - 10.5 K/uL   RBC 4.67 4.22 - 5.81 MIL/uL   Hemoglobin 14.6 13.0 - 17.0 g/dL   HCT 60.4 54.0 - 98.1 %   MCV 90.8 80.0 - 100.0 fL   MCH 31.3 26.0 - 34.0 pg   MCHC 34.4 30.0 - 36.0 g/dL   RDW 19.1 47.8 - 29.5 %   Platelets 229 150 - 400 K/uL   nRBC 0.0 0.0 - 0.2 %    Comment: Performed at [facility], [address], [place], [place] [address]  Troponin I (High Sensitivity)     Status: None   Collection Time: 11/15/22  6:00 AM  Result Value Ref Range   Troponin I (High Sensitivity) 14 <18  ng/L    Comment: (NOTE) Elevated high sensitivity troponin I (hsTnI) values and significant  changes across serial measurements may suggest ACS but many other  chronic and acute conditions are known to elevate hsTnI results.  Refer to the "Links" section for chest pain algorithms and additional  guidance. Performed at [facility], [address] [place], [place] [address]   TSH     Status: None   Collection Time: 11/15/22  7:25 AM  Result Value Ref Range   TSH 1.948 0.350 - 4.500 uIU/mL    Comment: Performed by a 3rd Generation assay with a functional sensitivity of <=0.01 uIU/mL. Performed at [facility], [address], [place], [place] [address]   Troponin I (High Sensitivity)     Status: None   Collection Time: 11/15/22  8:35 AM  Result Value Ref Range   Troponin I (High Sensitivity) 12 <18 ng/L    Comment: (NOTE) Elevated high sensitivity troponin I (hsTnI) values and significant  changes across serial measurements may suggest ACS but many other  chronic and acute conditions are known to elevate hsTnI results.  Refer to the "Links" section for chest pain algorithms and additional  guidance. Performed at [facility], [address], [place], [place] [address]    DG Chest Port 1 View  Result Date: 11/15/2022 CLINICAL DATA:  Atrial fibrillation EXAM: PORTABLE CHEST 1 VIEW COMPARISON:  12/15/2019 FINDINGS: The heart size and mediastinal contours are within normal limits. Both lungs are clear. The visualized skeletal structures are unremarkable. IMPRESSION: No active disease. Electronically Signed   By: Charlett Nose M.D.   On: 11/15/2022 06:43    Pending Labs Unresulted Labs (From admission, onward)     Start     Ordered   11/16/22 0500  CBC  Daily,   R     See Hyperspace for full Linked Orders Report.   11/15/22 0759   Signed and Held  Hemoglobin A1c  Tomorrow morning,   R        Signed and Held   Signed and Held  Basic metabolic panel  Tomorrow  morning,   R        Signed and Held   Signed and Held  Lipid panel  Tomorrow morning,   R        Signed and Held   Signed and Held  CBC  Tomorrow morning,   R        Signed and Held            Vitals/Pain Today's Vitals   11/15/22  0900 11/15/22 1000 11/15/22 1032 11/15/22 1100  BP: (!) 127/103 123/70  124/72  Pulse: 73 (!) 132 (!) 119 93  Resp: 15 13  18   Temp:   99.1 F (37.3 C)   TempSrc:   Oral   SpO2: 98% 97%  95%  Weight:      Height:      PainSc:        Isolation Precautions No active isolations  Medications Medications  diltiazem (CARDIZEM) 1 mg/mL load via infusion 10 mg (10 mg Intravenous Bolus from Bag 11/15/22 0731)    And  diltiazem (CARDIZEM) 125 mg in dextrose 5% 125 mL (1 mg/mL) infusion (15 mg/hr Intravenous Rate/Dose Change 11/15/22 1032)  apixaban (ELIQUIS) tablet 5 mg (has no administration in time range)  heparin bolus via infusion 5,000 Units (5,000 Units Intravenous Bolus from Bag 11/15/22 0842)    Mobility walks     Focused Assessments Cardiac Assessment Handoff:  Cardiac Rhythm: Atrial fibrillation No results found for: "CKTOTAL", "CKMB", "CKMBINDEX", "TROPONINI" Lab Results  Component Value Date   DDIMER 0.30 11/11/2019   Does the Patient currently have chest pain? No    R Recommendations: See Admitting Provider Note  Report given to:   Additional Notes:

## 2022-11-15 NOTE — ED Provider Notes (Signed)
[place] EMERGENCY DEPARTMENT AT [facility] Provider Note   CSN: [id number] Arrival date & time: 11/15/22  4098     History {Add pertinent medical, surgical, social history, OB history to HPI:1} Chief Complaint  Patient presents with   Atrial Fibrillation    Edward Obrien is a 52 y.o. male.  Patient presents to the emergency department because his watch woke him up and alerted him that he was in atrial fibrillation.  Patient denies chest pain, shortness of breath.  He is without complaints.       Home Medications Prior to Admission medications   Medication Sig Start Date End Date Taking? Authorizing Provider  aspirin EC 81 MG tablet Take 81 mg by mouth daily. Swallow whole.    [provider]  aspirin-acetaminophen-caffeine (EXCEDRIN MIGRAINE) [id number] MG tablet Take 1 tablet by mouth every 6 (six) hours as needed for headache.    [provider]  furosemide (LASIX) 20 MG tablet Take 1 tablet (20 mg total) by mouth as needed. PATIENT MUST SCHEDULE APPOINTMENT FOR FUTURE REFILLS FIRST ATTEMPT 09/01/22   Croitoru, Rachelle Hora, MD  lisinopril (ZESTRIL) 10 MG tablet Take 1 tablet (10 mg total) by mouth daily. PATIENT MUST SCHEDULE APPOINTMENT FOR FUTURE REFILLS FIRST ATTEMPT 09/29/22   Croitoru, Mihai, MD  Multiple Vitamin (MULTIVITAMIN) capsule Take 1 capsule by mouth daily.    [provider]  nitroGLYCERIN (NITROSTAT) 0.4 MG SL tablet Place 1 tablet (0.4 mg total) under the tongue every 5 (five) minutes as needed for chest pain. 10/31/19   Arrien, York Ram, MD  Omega 3 1200 MG CAPS Take 2,400 mg by mouth daily.    [provider]      Allergies    Patient has no known allergies.    Review of Systems   Review of Systems  Physical Exam Updated Vital Signs BP 120/72 (BP Location: Left Arm)   Pulse 83   Temp 98.6 F (37 C) (Oral)   Resp 18   SpO2 96%  Physical Exam Vitals and nursing note reviewed.  Constitutional:       General: He is not in acute distress.    Appearance: He is well-developed.  HENT:     Head: Normocephalic and atraumatic.     Mouth/Throat:     Mouth: Mucous membranes are moist.  Eyes:     General: Vision grossly intact. Gaze aligned appropriately.     Extraocular Movements: Extraocular movements intact.     Conjunctiva/sclera: Conjunctivae normal.  Cardiovascular:     Rate and Rhythm: Tachycardia present. Rhythm regularly irregular.     Pulses: Normal pulses.     Heart sounds: Normal heart sounds, S1 normal and S2 normal. No murmur heard.    No friction rub. No gallop.  Pulmonary:     Effort: Pulmonary effort is normal. No respiratory distress.     Breath sounds: Normal breath sounds.  Abdominal:     Palpations: Abdomen is soft.     Tenderness: There is no abdominal tenderness. There is no guarding or rebound.     Hernia: No hernia is present.  Musculoskeletal:        General: No swelling.     Cervical back: Full passive range of motion without pain, normal range of motion and neck supple. No pain with movement, spinous process tenderness or muscular tenderness. Normal range of motion.     Right lower leg: No edema.     Left lower leg: No edema.  Skin:  General: Skin is warm and dry.     Capillary Refill: Capillary refill takes less than 2 seconds.     Findings: No ecchymosis, erythema, lesion or wound.  Neurological:     Mental Status: He is alert and oriented to person, place, and time.     GCS: GCS eye subscore is 4. GCS verbal subscore is 5. GCS motor subscore is 6.     Cranial Nerves: Cranial nerves 2-12 are intact.     Sensory: Sensation is intact.     Motor: Motor function is intact. No weakness or abnormal muscle tone.     Coordination: Coordination is intact.  Psychiatric:        Mood and Affect: Mood normal.        Speech: Speech normal.        Behavior: Behavior normal.     ED Results / Procedures / Treatments   Labs (all labs ordered are listed, but only  abnormal results are displayed) Labs Reviewed  BASIC METABOLIC PANEL - Abnormal; Notable for the following components:      Result Value   CO2 19 (*)    All other components within normal limits  CBC  TSH  TROPONIN I (HIGH SENSITIVITY)    EKG None  Radiology DG Chest Port 1 View  Result Date: 11/15/2022 CLINICAL DATA:  Atrial fibrillation EXAM: PORTABLE CHEST 1 VIEW COMPARISON:  12/15/2019 FINDINGS: The heart size and mediastinal contours are within normal limits. Both lungs are clear. The visualized skeletal structures are unremarkable. IMPRESSION: No active disease. Electronically Signed   By: Charlett Nose M.D.   On: 11/15/2022 06:43    Procedures Procedures  {Document cardiac monitor, telemetry assessment procedure when appropriate:1}  Medications Ordered in ED Medications - No data to display  ED Course/ Medical Decision Making/ A&P   {   Click here for ABCD2, HEART and other calculatorsREFRESH Note before signing :1}      CHA2DS2-VASc Score: 2                    Medical Decision Making Amount and/or Complexity of Data Reviewed Labs: ordered. Radiology: ordered.   Patient with history of hypertension, PSVT, obesity, obstructive sleep apnea, CAD by cardiac CT, hyperlipidemia presents to the ER in atrial fibrillation.  Patient reports that he discovered he was in atrial fibrillation when his Apple Watch alerted him.  He is asymptomatic at arrival.  {Document critical care time when appropriate:1} {Document review of labs and clinical decision tools ie heart score, Chads2Vasc2 etc:1}  {Document your independent review of radiology images, and any outside records:1} {Document your discussion with family members, caretakers, and with consultants:1} {Document social determinants of health affecting pt's care:1} {Document your decision making why or why not admission, treatments were needed:1} Final Clinical Impression(s) / ED Diagnoses Final diagnoses:  Atrial  fibrillation with RVR (HCC)    Rx / DC Orders ED Discharge Orders     None

## 2022-11-15 NOTE — ED Triage Notes (Addendum)
Pt presents for SOB and having been awoken by his apple watch notifying him that he is in afib at a rate of 140 since 0240 this AM.  Denies h/o afib Denies nausea.  Pt has h/o bradycardia which he states PCP was not concerned about, states his ususal resting is in 40s. Not on a beta blocker.

## 2022-11-15 NOTE — H&P (Addendum)
Cardiology Admission History and Physical   Patient ID: Edward Obrien MRN: [id number]; DOB: Nov 19, 1970   Admission date: 11/15/2022  PCP:  Eartha Inch, MD   [facility] Providers Cardiologist:  Thurmon Fair, MD   {  Chief Complaint:  Afib RVR  Patient Profile:   Edward Obrien is a 52 y.o. male with a hx of morbid obesity, nonobstructive CAD (mild to moderate stenosis of the proximal and mid LAD by CT angiogram 2021), SVT, RBBB+LPFB, hypertension, hyperlipidemia, OSA on CPAP, isolated seizure in 09/2021 in setting of ETOH in who is being seen 11/15/2022 for the evaluation of atrial fibrillation at the request of Dr. Anitra Lauth.   History of Present Illness:   Mr. Mela has history of elevated calcium score but nonobstructive coronary stenosis by coronary CT angiogram on 10/31/2019 and a normal echocardiogram that noted mild dilation of the aortic root. He is being followed by Dr. Royann Shivers who saw patient in November of 2022 and doing well.  He also has had a Holter monitor that revealed SVT that was attributed to likely untreated sleep apnea however was not clinically significant and without any complaints of palpitations. Patient also saw neurology post discharge for a first occurrence of a seizure in March of 2023. His wife had reported "patient was standing then arched his back then fell to the ground and did have generalized tonic-clonic activity lasted about 40 seconds. After which he appeared confused, was talking gibberish." He had a brain MRI during that admission and was negative for any acute abnormalities and normal EEG. Notes also state that this occurred with heavy alcohol consumption and had reported 3 glasses of mixed drinks (vodka) and his blood alcohol level was 242.  He has not had any seizures since then. More recently patient was seen by his PCP on May 1st 2024 and had some complaints of feeling lightheaded after standing and hypotension 2 times in a month.   He denied any palpitations or other symptoms at that time. His PCP decreased his lisinopril from 10 mg to 5 mg.   Today, patient reports being notified by his Apple Watch that he went into atrial fibrillation during the night.  At that time he had minor complaints of heart palpitations but denied any accompanying shortness of breath.  Patient is very active and often walks 20,000 steps in the day and has not noticed any feelings of chest pain, dizziness, lightheadedness, palpitations, shortness of breath.  He does report some issues with mild peripheral edema which he uses his Lasix 20 mg 2 or 3 times during the week.  However, he used to only need use it once a week . Now, he notes that he has had more difficulties with swelling the past 2-3 months.  He also states that he has gained about 50 pounds in the last year.  He has now become more active and has been eating healthier but still eats out 3-4 times a week.  Patient denies any orthopnea, paroxysmal nocturnal dyspnea, exertional dyspnea, cough.  Patient also reports frequent alcohol use drinking about 3 times per week.  Yesterday, he had 4 mixed drinks of ginger ale and whiskey.  He also drinks 3 cups of coffee daily.     EKG showing atrial fibrillation RVR with HR 139. RBBB and non specific T wave changes diffusely.  Patient received 10 mg bolus of IV diltiazem followed by a drip and started on heparin.  His heart rates have improved but still  fluctuated between 80-120. TSH normal.  Negative troponins.  Negative chest x-ray.   Past Medical History:  Diagnosis Date   CAD (coronary artery disease) 10/30/2019   mild-mod by cardiac CT>>med rx   HTN (hypertension)    Hyperlipidemia LDL goal <70    OSA on CPAP 11/2019   SVT (supraventricular tachycardia)     Past Surgical History:  Procedure Laterality Date   CARDIAC CATHETERIZATION  10/30/2019   mild-mod dz>>med rx   VASECTOMY       Medications Prior to Admission: Prior to Admission  medications   Medication Sig Start Date End Date Taking? Authorizing Provider  aspirin EC 81 MG tablet Take 81 mg by mouth daily. Swallow whole.   Yes [provider]  aspirin-acetaminophen-caffeine (EXCEDRIN MIGRAINE) [phone] MG tablet Take 1 tablet by mouth every 6 (six) hours as needed for headache.   Yes [provider]  furosemide (LASIX) 20 MG tablet Take 1 tablet (20 mg total) by mouth as needed. PATIENT MUST SCHEDULE APPOINTMENT FOR FUTURE REFILLS FIRST ATTEMPT 09/01/22  Yes Croitoru, Mihai, MD  lisinopril (ZESTRIL) 10 MG tablet Take 1 tablet (10 mg total) by mouth daily. PATIENT MUST SCHEDULE APPOINTMENT FOR FUTURE REFILLS FIRST ATTEMPT 09/29/22  Yes Croitoru, Mihai, MD  Multiple Vitamin (MULTIVITAMIN) capsule Take 1 capsule by mouth daily.   Yes [provider]  nitroGLYCERIN (NITROSTAT) 0.4 MG SL tablet Place 1 tablet (0.4 mg total) under the tongue every 5 (five) minutes as needed for chest pain. 10/31/19  Yes Arrien, York Ram, MD  Omega 3 1200 MG CAPS Take 2,400 mg by mouth daily.   Yes [provider]     Allergies:   No Known Allergies  Social History:   Social History   Socioeconomic History   Marital status: Married    Spouse name: Not on file   Number of children: Not on file   Years of education: Not on file   Highest education level: Not on file  Occupational History   Not on file  Tobacco Use   Smoking status: Every Day    Types: Cigars   Smokeless tobacco: Never  Substance and Sexual Activity   Alcohol use: Never   Drug use: Never   Sexual activity: Yes  Other Topics Concern   Not on file  Social History Narrative   Not on file   Social Determinants of Health   Financial Resource Strain: Not on file  Food Insecurity: Not on file  Transportation Needs: Not on file  Physical Activity: Not on file  Stress: Not on file  Social Connections: Not on file  Intimate Partner Violence: Not on file    Family History:    The patient's family history includes Alzheimer's disease in his mother; Diabetes in his mother; Failure to thrive in his father; Hypertension in his mother.    ROS:  Please see the history of present illness.  All other ROS reviewed and negative.     Physical Exam/Data:   Vitals:   11/15/22 0630 11/15/22 0730 11/15/22 0900 11/15/22 1000  BP: 120/72 109/79 (!) 127/103 123/70  Pulse:  97 73   Resp: 18 (!) 22 15 13   Temp: 98.6 F (37 C)     TempSrc: Oral     SpO2: 96% 94% 98% 97%  Weight:  (!) 153.6 kg    Height:  6\' 3"  (1.905 m)     No intake or output data in the 24 hours ending 11/15/22 1030  11/15/2022    7:30 AM 04/07/2022    8:59 AM 09/23/2021   10:56 AM  Last 3 Weights  Weight (lbs) 338 lb 10 oz 323 lb 308 lb  Weight (kg) 153.6 kg 146.512 kg 139.708 kg     Body mass index is 42.33 kg/m.  General:  Well nourished, well developed, in no acute distress HEENT: normal Neck: no JVD Vascular: No carotid bruits; Distal pulses 2+ bilaterally   Cardiac: Irregularly irregular, no m/r/g  Lungs:  clear to auscultation bilaterally, no wheezing, rhonchi or rales  Abd: soft, nontender, no hepatomegaly  Ext: Trace edema on RLE Musculoskeletal:  No deformities, BUE and BLE strength normal and equal Skin: warm and dry  Neuro:  CNs 2-12 intact, no focal abnormalities noted Psych:  Normal affect    EKG:  The ECG that was done showing atrial fibrillation RVR with HR 139. RBBB and non specific T wave changes diffusely.  Relevant CV Studies: Coronary CTA 10/31/2019 1. Coronary calcium score of 56. This was 80 percentile for age and sex matched control.   2. Normal coronary origin with right dominance.   3. Study quality and interpretation affected by patient's size. CAD-RADS 3. Mild to moderate stenosis in the proximal and mid LAD. Consider symptom-guided anti-ischemic pharmacotherapy as well as aggressive risk factor modification per guideline directed care.   4. Mildly  dilated pulmonary artery suggestive of pulmonary hypertension.  Echocardiogram 10/30/2019 1. Left ventricular ejection fraction, by estimation, is 55 to 60%. The  left ventricle has normal function. Left ventricular endocardial border  not optimally defined to evaluate regional wall motion. Left ventricular  diastolic parameters are consistent  with Grade I diastolic dysfunction (impaired relaxation).   2. Right ventricular systolic function is normal. The right ventricular  size is mildly enlarged. Tricuspid regurgitation signal is inadequate for  assessing PA pressure.   3. The mitral valve is normal in structure. Trivial mitral valve  regurgitation. No evidence of mitral stenosis.   4. The aortic valve was not well visualized. Aortic valve regurgitation  is not visualized. No aortic stenosis is present.   5. Aortic dilatation noted. There is mild dilatation of the aortic root  measuring 40 mm.   6. The inferior vena cava is dilated in size with <50% respiratory  variability, suggesting right atrial pressure of 15 mmHg.  Laboratory Data:  High Sensitivity Troponin:   Recent Labs  Lab 11/15/22 0600 11/15/22 0835  TROPONINIHS 14 12      Chemistry Recent Labs  Lab 11/15/22 0600  NA 139  K 4.0  CL 107  CO2 19*  GLUCOSE 92  BUN 16  CREATININE 1.08  CALCIUM 8.9  GFRNONAA >60  ANIONGAP 13    No results for input(s): "PROT", "ALBUMIN", "AST", "ALT", "ALKPHOS", "BILITOT" in the last 168 hours. Lipids No results for input(s): "CHOL", "TRIG", "HDL", "LABVLDL", "LDLCALC", "CHOLHDL" in the last 168 hours. Hematology Recent Labs  Lab 11/15/22 0600  WBC 7.9  RBC 4.67  HGB 14.6  HCT 42.4  MCV 90.8  MCH 31.3  MCHC 34.4  RDW 12.1  PLT 229   Thyroid  Recent Labs  Lab 11/15/22 0725  TSH 1.948   BNPNo results for input(s): "BNP", "PROBNP" in the last 168 hours.  DDimer No results for input(s): "DDIMER" in the last 168 hours.   Radiology/Studies:  DG Chest Port 1  View  Result Date: 11/15/2022 CLINICAL DATA:  Atrial fibrillation EXAM: PORTABLE CHEST 1 VIEW COMPARISON:  12/15/2019 FINDINGS: The heart  size and mediastinal contours are within normal limits. Both lungs are clear. The visualized skeletal structures are unremarkable. IMPRESSION: No active disease. Electronically Signed   By: Charlett Nose M.D.   On: 11/15/2022 06:43     Assessment and Plan:   New onset paroxysmal atrial fibrillation RVR Patient presenting with first occurrence of atrial fibrillation.  Currently he is asymptomatic and has only noticed his palpitations once at onset when awoken by his watch. Denies any accompanying shortness of breath or dizziness.  He has admitted to frequent alcohol (4 mixed drinks of whiskey and ginger ale) and caffeine use (3 cups daily).  Has been started on a diltiazem bolus and currently on a drip.  Rates have improved somewhat and fluctuated between 80-120. Continue diltiazem drip with possible plans to cardiovert outpatient or inpatient if he does not spontaneously convert to NSR with rate control strategies during this admission.  Has history of asymptomatic bradycardia so need to be closely monitoring vitals for when and if he converts. CHADVASC score=2. Shared decision making has been discussed.  Patient would be amenable to anticoagulation. Currently on heparin, will need to transition to Eliquis.  Will order echo  Will recheck A1c to evaluate for CHA2DS2-VASc risk factors.  SVT Has occurrences noted on tele. No complaints of palpitations. Likely worsened by RVR state. Will continue to monitor. Would not use BB due to history of bradycardia and current dilt drip.   Hypertension Blood pressure well-controlled throughout this admission.   PTA lisinopril 5mg . Can hold off since on dilt drip and well controlled.   Nonobstructive CAD CT angiogram in 2021 indicated mild to moderate stenosis of the proximal and mid LAD.  Proximal and mid LAD has mild to  moderate diffuse mixed plaque with stenosis 25-49% and possibly 50-69% in the mid portion. Distal LAD has no significant disease.Asymptomatic and has no complaints of chest pain. Discussed with and will stop aspirin.  Will check Lp (a)  Hyperlipidemia Will recheck.  Not currently on a statin  OSA on CPAP Reports compliancy and rarely does not use.   Mild aortic root dilation (40mm) Continue to monitor on echo  Orthostatic hypotension At recent office visit patient reported feeling lightheaded.  Orthostatic vital signs have been done and negative for orthostatic hypotension.  Frequent alcohol and caffeine use Patient has been counseled on these risk factors that are likely contributing to his atrial fibrillation. Will initiate CIWA protocol.   CODE STATUS: Full code Risk Assessment/Risk Scores:   CHA2DS2-VASc Score = 2  {indicates a 2.2% annual risk of stroke. The patient's score is based upon: CHF History: 0 HTN History: 1 Diabetes History: 0 Stroke History: 0 Vascular Disease History: 1 Age Score: 0 Gender Score: 0     For questions or updates, please contact [facility] Please consult [email or web address] for contact info under     Signed, Abagail Kitchens, PA-C  11/15/2022 10:30 AM    Patient seen and examined.   I agree with findings as noted by Thamas Jaegers above  Pt a 52 yo with mild to mod CAD (by CTA 2021; CA score)54, HTN, SVT, OSA (on CPAP), hx of seizure, EtOH abuse  Pt active, walks   He does note over past couple months needing to use lasix more often for intermitt LE edema Presents after notification on Apple Watch of Afib during night    Noted some palpitations  Currently rates better controlled on IV diltiazem     He appears comfortable in  bed Neck:   JVP is normal  Lungs are CTA Cardiac exam   Irreg irreg   No S3    No significant murmurs Abd benign Ext are without edema     Feet warm     Recomm  Continue IV diltiazem    Transition to PO diltiazem  and poss metoprolol tomorrow Echo to evaluate cardiac anatomy, function esp with hx of intermitt edema, sleep apnea, HTN and EtOH use   If rates controlled, plan for d/c on oral agents with follow up in 4 wks for cardioversion If rates uncontrolled plan for TEE cardioversion      Will continue to follow   Dietrich Pates MD

## 2022-11-15 NOTE — ED Notes (Signed)
Dr. Pollina at bedside   

## 2022-11-15 NOTE — Progress Notes (Signed)
ANTICOAGULATION CONSULT NOTE - Initial Consult  Pharmacy Consult for heparin Indication: atrial fibrillation  No Known Allergies  Patient Measurements: Height: 6\' 3"  (190.5 cm) Weight: (!) 153.6 kg (338 lb 10 oz) IBW/kg (Calculated) : 84.5 Heparin Dosing Weight: 120kg  Vital Signs: Temp: 98.6 F (37 C) (05/12 0630) Temp Source: Oral (05/12 0630) BP: 109/79 (05/12 0730) Pulse Rate: 97 (05/12 0730)  Labs: Recent Labs    11/15/22 0600  HGB 14.6  HCT 42.4  PLT 229  CREATININE 1.08  TROPONINIHS 14    Estimated Creatinine Clearance: 125.4 mL/min (by C-G formula based on SCr of 1.08 mg/dL).   Medical History: Past Medical History:  Diagnosis Date   CAD (coronary artery disease) 10/30/2019   mild-mod by cardiac CT>>med rx   HTN (hypertension)    Hyperlipidemia LDL goal <70    OSA on CPAP 11/2019   SVT (supraventricular tachycardia)     Assessment: 51yo male c/o SOB and reports that his watch alerted him that he was in Afib to 140s (reports resting HR in 40s), no known h/o Afib >> to begin heparin.  Goal of Therapy:  Heparin level 0.3-0.7 units/ml Monitor platelets by anticoagulation protocol: Yes   Plan:  Heparin 5000 units IV bolus x1 followed by infusion at 1900 units/hr. Monitor heparin levels and CBC.  Vernard Gambles, PharmD, BCPS  11/15/2022,7:55 AM

## 2022-11-16 ENCOUNTER — Observation Stay (HOSPITAL_BASED_OUTPATIENT_CLINIC_OR_DEPARTMENT_OTHER): Payer: BC Managed Care – PPO

## 2022-11-16 ENCOUNTER — Other Ambulatory Visit (HOSPITAL_COMMUNITY): Payer: Self-pay

## 2022-11-16 ENCOUNTER — Telehealth (HOSPITAL_COMMUNITY): Payer: Self-pay | Admitting: Pharmacy Technician

## 2022-11-16 DIAGNOSIS — I4891 Unspecified atrial fibrillation: Secondary | ICD-10-CM

## 2022-11-16 DIAGNOSIS — G4733 Obstructive sleep apnea (adult) (pediatric): Secondary | ICD-10-CM

## 2022-11-16 LAB — LIPID PANEL
Cholesterol: 143 mg/dL (ref 0–200)
HDL: 37 mg/dL — ABNORMAL LOW (ref >40)
LDL Cholesterol: 90 mg/dL (ref 0–99)
Total CHOL/HDL Ratio: 3.9 RATIO
Triglycerides: 78 mg/dL (ref <150)
VLDL: 16 mg/dL (ref 0–40)

## 2022-11-16 LAB — BASIC METABOLIC PANEL
Anion gap: 7 (ref 5–15)
BUN: 17 mg/dL (ref 6–20)
CO2: 22 mmol/L (ref 22–32)
Calcium: 8.3 mg/dL — ABNORMAL LOW (ref 8.9–10.3)
Chloride: 103 mmol/L (ref 98–111)
Creatinine, Ser: 0.98 mg/dL (ref 0.61–1.24)
GFR, Estimated: >60 mL/min (ref >60)
Glucose, Bld: 103 mg/dL — ABNORMAL HIGH (ref 70–99)
Potassium: 3.6 mmol/L (ref 3.5–5.1)
Sodium: 132 mmol/L — ABNORMAL LOW (ref 135–145)

## 2022-11-16 LAB — ECHOCARDIOGRAM COMPLETE
AR max vel: 4.22 cm2
AV Area VTI: 4.32 cm2
AV Area mean vel: 4.3 cm2
AV Mean grad: 2 mmHg
AV Peak grad: 3.6 mmHg
Ao pk vel: 0.94 m/s
Area-P 1/2: 4.76 cm2
Calc EF: 54.3 %
Height: 75 in
MV VTI: 4.1 cm2
S' Lateral: 4.4 cm
Single Plane A2C EF: 53.2 %
Single Plane A4C EF: 53.2 %
Weight: 5414.4 oz

## 2022-11-16 LAB — CBC
HCT: 37.9 % — ABNORMAL LOW (ref 39.0–52.0)
Hemoglobin: 13 g/dL (ref 13.0–17.0)
MCH: 30.6 pg (ref 26.0–34.0)
MCHC: 34.3 g/dL (ref 30.0–36.0)
MCV: 89.2 fL (ref 80.0–100.0)
Platelets: 221 10*3/uL (ref 150–400)
RBC: 4.25 MIL/uL (ref 4.22–5.81)
RDW: 12.1 % (ref 11.5–15.5)
WBC: 7.3 10*3/uL (ref 4.0–10.5)
nRBC: 0 % (ref 0.0–0.2)

## 2022-11-16 LAB — HEMOGLOBIN A1C
Hgb A1c MFr Bld: 5.2 % (ref 4.8–5.6)
Mean Plasma Glucose: 102.54 mg/dL

## 2022-11-16 MED ORDER — DILTIAZEM HCL ER COATED BEADS 180 MG PO CP24
180.0000 mg | ORAL_CAPSULE | Freq: Every day | ORAL | 2 refills | Status: DC
  Filled 2022-11-16: qty 30, 30d supply, fill #0

## 2022-11-16 MED ORDER — DILTIAZEM HCL ER COATED BEADS 180 MG PO CP24
180.0000 mg | ORAL_CAPSULE | Freq: Every day | ORAL | Status: DC
  Administered 2022-11-16: 180 mg via ORAL
  Filled 2022-11-16: qty 1

## 2022-11-16 MED ORDER — PERFLUTREN LIPID MICROSPHERE
1.0000 mL | INTRAVENOUS | Status: AC | PRN
  Administered 2022-11-16: 3 mL via INTRAVENOUS

## 2022-11-16 MED ORDER — APIXABAN 5 MG PO TABS
5.0000 mg | ORAL_TABLET | Freq: Two times a day (BID) | ORAL | 3 refills | Status: DC
  Filled 2022-11-16: qty 60, 30d supply, fill #0

## 2022-11-16 NOTE — Progress Notes (Addendum)
Rounding Note    Patient Name: Edward Obrien Date of Encounter: 11/16/2022   [facility] Cardiologist: Thurmon Fair, MD   Subjective   Patient reports mild awareness of irregular heart rate/rhythm. Denies shortness of breath, chest pain.   Inpatient Medications    Scheduled Meds:  apixaban  5 mg Oral BID   folic acid  1 mg Oral Daily   LORazepam  0-4 mg Oral Q6H   Followed by   Melene Muller ON 11/17/2022] LORazepam  0-4 mg Oral Q12H   multivitamin with minerals  1 tablet Oral Daily   sodium chloride flush  3 mL Intravenous Q12H   thiamine  100 mg Oral Daily   Or   thiamine  100 mg Intravenous Daily   Continuous Infusions:  sodium chloride     diltiazem (CARDIZEM) infusion 5 mg/hr (11/16/22 0312)   PRN Meds: sodium chloride, acetaminophen, LORazepam, ondansetron (ZOFRAN) IV, sodium chloride flush   Vital Signs    Vitals:   11/15/22 2314 11/16/22 0145 11/16/22 0541 11/16/22 0804  BP: 100/69 99/68 111/73 115/70  Pulse: 74 74 67 70  Resp: 18  19 19   Temp: 98.2 F (36.8 C)  98.2 F (36.8 C) 98.7 F (37.1 C)  TempSrc: Oral  Oral Oral  SpO2:      Weight:   (!) 153.5 kg   Height:        Intake/Output Summary (Last 24 hours) at 11/16/2022 1016 Last data filed at 11/16/2022 1610 Gross per 24 hour  Intake 250.93 ml  Output --  Net 250.93 ml      11/16/2022    5:41 AM 11/15/2022    7:30 AM 04/07/2022    8:59 AM  Last 3 Weights  Weight (lbs) 338 lb 6.4 oz 338 lb 10 oz 323 lb  Weight (kg) 153.497 kg 153.6 kg 146.512 kg      Telemetry    Atrial fibrillation with fluctuant ventricular rates, 80s-130s. - Personally Reviewed  ECG    No new tracing - Personally Reviewed  Physical Exam   GEN: No acute distress.   Neck: No JVD Cardiac: irregularly irregular, no murmurs, rubs, or gallops.  Respiratory: Clear to auscultation bilaterally. GI: Soft, nontender, non-distended  MS: No edema; No deformity. Neuro:  Nonfocal  Psych: Normal affect   Labs     High Sensitivity Troponin:   Recent Labs  Lab 11/15/22 0600 11/15/22 0835  TROPONINIHS 14 12     Chemistry Recent Labs  Lab 11/15/22 0600 11/16/22 0233  NA 139 132*  K 4.0 3.6  CL 107 103  CO2 19* 22  GLUCOSE 92 103*  BUN 16 17  CREATININE 1.08 0.98  CALCIUM 8.9 8.3*  GFRNONAA >60 >60  ANIONGAP 13 7    Lipids  Recent Labs  Lab 11/16/22 0233  CHOL 143  TRIG 78  HDL 37*  LDLCALC 90  CHOLHDL 3.9    Hematology Recent Labs  Lab 11/15/22 0600 11/16/22 0233  WBC 7.9 7.3  RBC 4.67 4.25  HGB 14.6 13.0  HCT 42.4 37.9*  MCV 90.8 89.2  MCH 31.3 30.6  MCHC 34.4 34.3  RDW 12.1 12.1  PLT 229 221   Thyroid  Recent Labs  Lab 11/15/22 0725  TSH 1.948    BNPNo results for input(s): "BNP", "PROBNP" in the last 168 hours.  DDimer No results for input(s): "DDIMER" in the last 168 hours.   Radiology    DG Chest Port 1 View  Result Date: 11/15/2022 CLINICAL  DATA:  Atrial fibrillation EXAM: PORTABLE CHEST 1 VIEW COMPARISON:  12/15/2019 FINDINGS: The heart size and mediastinal contours are within normal limits. Both lungs are clear. The visualized skeletal structures are unremarkable. IMPRESSION: No active disease. Electronically Signed   By: Charlett Nose M.D.   On: 11/15/2022 06:43    Cardiac Studies   10/31/19 Cardiac CTA  IMPRESSION: 1. Coronary calcium score of 56. This was 97 percentile for age and sex matched control.   2. Normal coronary origin with right dominance.   3. Study quality and interpretation affected by patient's size. CAD-RADS 3. Mild to moderate stenosis in the proximal and mid LAD. Consider symptom-guided anti-ischemic pharmacotherapy as well as aggressive risk factor modification per guideline directed care.   4. Mildly dilated pulmonary artery suggestive of pulmonary hypertension.  Patient Profile     Edward Obrien is a 52 y.o. male with a hx of morbid obesity, nonobstructive CAD (mild to moderate stenosis of the proximal and mid  LAD by CT angiogram 2021), SVT, RBBB+LPFB, hypertension, hyperlipidemia, OSA on CPAP, isolated seizure in 09/2021 in setting of ETOH in who is being seen for the evaluation of atrial fibrillation.  Assessment & Plan    New onset paroxysmal atrial fibrillation RVR  Patient presented yesterday with first occurrence of atrial fibrillation.  Reports minimal symptoms. Denies any accompanying shortness of breath or dizziness.  He reports regular alcohol and caffeine consumptom.  Patient started on IV diltiazem yesterday with improved rates.  Afib risk factors include OSA, caffeine and alcohol use Transition to oral cardizam CD 180mg . Continue Eliquis 5mg  BID. Will plan for afib clinic follow up and DCCV following 3 weeks uninterrupted OAC.   Hypertension Blood pressure stable.    Nonobstructive CAD  CT angiogram in 2021 indicated mild to moderate stenosis of the proximal and mid LAD.  Proximal and mid LAD has mild to moderate diffuse mixed plaque with stenosis 25-49% and possibly 50-69% in the mid portion. Distal LAD had no significant disease.  Recommended initiation of low dose statin as below.  Hyperlipidemia  Given CAD per cardiac CTA and LDL 90, recommend statin, with LDL goal <70.  Lab Results  Component Value Date   CHOL 143 11/16/2022   HDL 37 (L) 11/16/2022   LDLCALC 90 11/16/2022   TRIG 78 11/16/2022   CHOLHDL 3.9 11/16/2022     OSA on CPAP Continue nightly CPAP use.   Mild aortic root dilation (40mm) Continue to monitor on echo   Frequent alcohol and caffeine use  Patient educated on both as possible afib triggers.   For questions or updates, please contact  [facility] Please consult [email or web address] for contact info under        Signed, Perlie Gold, PA-C  11/16/2022, 10:16 AM    I have seen and examined the patient along with Perlie Gold, PA .  I have reviewed the chart, notes and new data.  I agree with PA/NP's note.  Key new complaints: He  feels great.  Once the arrhythmia rate was controlled he became asymptomatic. Key examination changes: Remains in atrial fibrillation with a regular rhythm and resting ventricular rate around 70-80 bpm on intravenous diltiazem.  No clinical findings of hypervolemia/heart failure.  Obesity. Key new findings / data: Echocardiogram pending.  PLAN: Anticipate discharge after we review his echocardiogram, unless we find serious structural abnormalities (I doubt that this will be the case). Continue oral anticoagulation with Eliquis and transition to oral diltiazem, with plan for follow-up in atrial  fibrillation clinic and scheduling him for cardioversion if he remains in atrial fibrillation.  Perform cardioversion after no less than 3 weeks of full anticoagulation. He is compliant with CPAP.  Recommended reduction in alcohol consumption and renewed efforts at weight loss. He has a significantly elevated coronary calcium score placing him in the 83rd percentile for age and recommend a Target LDL less than 70.  In the past he has been able to aggressively implement healthy lifestyle changes with substantial weight loss and improved metabolic parameters.  Will give him a few months to try to restart his healthy habits before starting him on a statin.  Current LDL is 90, significant improvement could be achievable through lifestyle changes. His aortic root is at most borderline dilated when one takes into account his body size.  In addition, I think the aorta was over measured on his echocardiogram.  Normal aortic diameter on CT of chest performed 10/31/2019.   Thurmon Fair, MD, [facility] [phone] 11/16/2022, 11:59 AM

## 2022-11-16 NOTE — TOC Benefit Eligibility Note (Signed)
Patient Product/process development scientist completed.    The patient is currently admitted and upon discharge could be taking Eliquis 5 mg.  The current 30 day co-pay is $583.04 due to a $7500 deductible.   The patient is insured through [employer]   This test claim was processed through [facility]- copay amounts may vary at other pharmacies due to [employer], or as the patient moves through the different stages of their insurance plan.  Edward Obrien, CPHT Pharmacy Patient Advocate Specialist [facility] Patient Advocate Team Direct Number: [phone]  Fax: [phone]

## 2022-11-16 NOTE — TOC Initial Note (Signed)
Transition of Care [facility]) - Initial/Assessment Note    Patient Details  Name: Edward Obrien MRN: [id number] Date of Birth: December 28, 1970  Transition of Care [facility]) CM/SW Contact:    Delilah Shan, LCSWA Phone Number: 11/16/2022, 11:46 AM  Clinical Narrative:                  CSW received consult for substance use resources for patient. CSW spoke with patient at bedside. Patient reports PTA he comes from home with spouse. CSW offered patient outpatient substance use treatment services resources. Patient accepted. Patient reports his spouse will pick him up when medically ready for dc. All questions answered. No further questions reported at this time.       Patient Goals and CMS Choice            Expected Discharge Plan and Services                                              Prior Living Arrangements/Services                       Activities of Daily Living Home Assistive Devices/Equipment: None ADL Screening (condition at time of admission) Patient's cognitive ability adequate to safely complete daily activities?: Yes Is the patient deaf or have difficulty hearing?: No Does the patient have difficulty seeing, even when wearing glasses/contacts?: No Does the patient have difficulty concentrating, remembering, or making decisions?: No Patient able to express need for assistance with ADLs?: Yes Does the patient have difficulty dressing or bathing?: No Independently performs ADLs?: Yes (appropriate for developmental age) Does the patient have difficulty walking or climbing stairs?: No Weakness of Legs: None Weakness of Arms/Hands: None  Permission Sought/Granted                  Emotional Assessment              Admission diagnosis:  Atrial fibrillation with RVR (HCC) [I48.91] Patient Active Problem List   Diagnosis Date Noted   Atrial fibrillation with RVR (HCC) 11/15/2022   Hypotension 11/12/2019   Chest pain 10/30/2019   Exertional  dyspnea 10/30/2019   Hyperglycemia 10/30/2019   OSA (obstructive sleep apnea)    Morbid obesity (HCC)    SVT (supraventricular tachycardia)    PCP:  Eartha Inch, MD Pharmacy:   [facility] [phone] - [place], [place] - [address] AT CORNER OF [address] [address]  [place] [address] Phone: [phone] Fax: [phone]  [facility] [id number] - [address], [place] - [address] [place] [address] Phone: [phone] Fax: [phone]     Social Determinants of Health (SDOH) Social History: SDOH Screenings   Food Insecurity: No Food Insecurity (11/15/2022)  Housing: Low Risk  (11/15/2022)  Transportation Needs: No Transportation Needs (11/15/2022)  Utilities: Not At Risk (11/15/2022)  Tobacco Use: High Risk (11/15/2022)   SDOH Interventions:     Readmission Risk Interventions     No data to display

## 2022-11-16 NOTE — Discharge Summary (Signed)
Discharge Summary    Patient ID: Edward Obrien MRN: [id number]; DOB: 1970-08-05  Admit date: 11/15/2022 Discharge date: 11/16/2022  PCP:  Eartha Inch, MD   [facility] Providers Cardiologist:  Thurmon Fair, MD        Discharge Diagnoses    Principal Problem:   Atrial fibrillation with RVR [facility])    Diagnostic Studies/Procedures    11/16/22 TTE  IMPRESSIONS     1. Left ventricular ejection fraction, by estimation, is 55 to 60%. The  left ventricle has normal function. The left ventricle has no regional  wall motion abnormalities. The left ventricular internal cavity size was  mildly dilated. Left ventricular  diastolic function could not be evaluated.   2. Right ventricular systolic function is normal. The right ventricular  size is normal.   3. The mitral valve is normal in structure. No evidence of mitral valve  regurgitation. No evidence of mitral stenosis.   4. The aortic valve is tricuspid. Aortic valve regurgitation is not  visualized. No aortic stenosis is present.   5. Aortic dilatation noted. There is mild dilatation of the aortic root,  measuring 40 mm. There is mild dilatation of the ascending aorta,  measuring 40 mm.   FINDINGS   Left Ventricle: Left ventricular ejection fraction, by estimation, is 55  to 60%. The left ventricle has normal function. The left ventricle has no  regional wall motion abnormalities. Definity contrast agent was given IV  to delineate the left ventricular   endocardial borders. The left ventricular internal cavity size was mildly  dilated. There is no left ventricular hypertrophy. Left ventricular  diastolic function could not be evaluated due to atrial fibrillation. Left  ventricular diastolic function could   not be evaluated.   Right Ventricle: The right ventricular size is normal. Right ventricular  systolic function is normal.   Left Atrium: Left atrial size was normal in size.   Right Atrium:  Right atrial size was normal in size.   Pericardium: Trivial pericardial effusion is present.   Mitral Valve: The mitral valve is normal in structure. No evidence of  mitral valve regurgitation. No evidence of mitral valve stenosis. MV peak  gradient, 3.6 mmHg. The mean mitral valve gradient is 1.3 mmHg.   Tricuspid Valve: The tricuspid valve is normal in structure. Tricuspid  valve regurgitation is trivial. No evidence of tricuspid stenosis.   Aortic Valve: The aortic valve is tricuspid. Aortic valve regurgitation is  not visualized. No aortic stenosis is present. Aortic valve mean gradient  measures 2.0 mmHg. Aortic valve peak gradient measures 3.6 mmHg. Aortic  valve area, by VTI measures 4.32  cm.   Pulmonic Valve: The pulmonic valve was not well visualized. Pulmonic valve  regurgitation is not visualized. No evidence of pulmonic stenosis.   Aorta: Aortic dilatation noted. There is mild dilatation of the aortic  root, measuring 40 mm. There is mild dilatation of the ascending aorta,  measuring 40 mm.   Venous: The inferior vena cava was not well visualized.   IAS/Shunts: The interatrial septum was not well visualized.   _____________   History of Present Illness     Edward Obrien is a 52 y.o. 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 On arrival to the ED,  patient reported being notified by his Apple Watch that he went into atrial fibrillation during the night. At that time he had minor complaints of heart palpitations but denied any accompanying shortness of breath. EKG showed afib with RVR, ventricular rate ~139 bpm.   Hospital Course     Consultants: n/a   New onset paroxysmal atrial fibrillation RVR   Patient presented yesterday  with first occurrence of atrial fibrillation.  Reports minimal symptoms now with adequate rate control following initiation of Diltiazem. Denies any accompanying shortness of breath or dizziness.  He reports regular alcohol and caffeine consumptom.   Afib risk factors include OSA, caffeine and alcohol use Transition to oral cardizam CD 180mg . Continue Eliquis 5mg  BID. Patient scheduled for afib clinic follow up. Anticipate DCCV following 3 weeks uninterrupted OAC.    Hypertension Blood pressure stable. Given new initiation of Cardizem CD, will have patient stop home Lisinopril.    Nonobstructive CAD   CT angiogram in 2021 indicated mild to moderate stenosis of the proximal and mid LAD.  Proximal and mid LAD has mild to moderate diffuse mixed plaque with stenosis 25-49% and possibly 50-69% in the mid portion. Distal LAD had no significant disease.   Recommended consideration of low dose statin as below.   Hyperlipidemia   Given CAD per cardiac CTA and LDL 90, recommend statin, with LDL goal <70. Patient wishes to try lifestyle/diet modification x2 months. Agreeable to start at that time if LDL remains above goal.      Did the patient have an acute coronary syndrome (MI, NSTEMI, STEMI, etc) this admission?:  No                               Did the patient have a percutaneous coronary intervention (stent / angioplasty)?:  No.    _____________  Discharge Vitals Blood pressure 132/86, pulse 79, temperature 99.2 F (37.3 C), temperature source Oral, resp. rate 18, height 6\' 3"  (1.905 m), weight (!) 153.5 kg, SpO2 96 %.  Filed Weights   11/15/22 0730 11/16/22 0541  Weight: (!) 153.6 kg (!) 153.5 kg    Labs & Radiologic Studies    CBC Recent Labs    11/15/22 0600 11/16/22 0233  WBC 7.9 7.3  HGB 14.6 13.0  HCT 42.4 37.9*  MCV 90.8 89.2  PLT 229 221   Basic Metabolic Panel Recent Labs    13/08/65 0600 11/16/22 0233  NA 139 132*  K 4.0 3.6  CL 107 103  CO2 19* 22   GLUCOSE 92 103*  BUN 16 17  CREATININE 1.08 0.98  CALCIUM 8.9 8.3*   Liver Function Tests No results for input(s): "AST", "ALT", "ALKPHOS", "BILITOT", "PROT", "ALBUMIN" in the last 72 hours. No results for input(s): "LIPASE", "AMYLASE" in the last 72 hours. High Sensitivity Troponin:   Recent Labs  Lab 11/15/22 0600 11/15/22 0835  TROPONINIHS 14 12    BNP Invalid input(s): "POCBNP" D-Dimer No results for input(s): "DDIMER" in the last 72 hours. Hemoglobin A1C Recent Labs    11/16/22 0233  HGBA1C 5.2   Fasting Lipid Panel Recent Labs    11/16/22 0233  CHOL 143  HDL 37*  LDLCALC 90  TRIG 78  CHOLHDL 3.9   Thyroid Function Tests Recent Labs    11/15/22 0725  TSH 1.948   _____________  DG Chest Port 1 View  Result Date: 11/15/2022 CLINICAL DATA:  Atrial fibrillation EXAM: PORTABLE CHEST 1 VIEW  COMPARISON:  12/15/2019 FINDINGS: The heart size and mediastinal contours are within normal limits. Both lungs are clear. The visualized skeletal structures are unremarkable. IMPRESSION: No active disease. Electronically Signed   By: Charlett Nose M.D.   On: 11/15/2022 06:43   Disposition   Pt is being discharged home today in good condition.  Follow-up Plans & Appointments     Follow-up Information     Eustace Pen, PA-C Follow up.   Specialty: Cardiology Contact information: [address] [place] [address] [phone]                Discharge Instructions     Diet - low sodium heart healthy   Complete by: As directed    Discharge instructions   Complete by: As directed    Please notify your cardiologist if you notice increased palpitations or more rapid HR. If you have sustained HR over 140bpm, chest pain, severe shortness of breath, please proceed to the nearest emergency department. Please do not miss any doses of Eliquis or Cardizem.   Increase activity slowly   Complete by: As directed         Discharge Medications   Allergies as  of 11/16/2022   No Known Allergies      Medication List     STOP taking these medications    lisinopril 10 MG tablet Commonly known as: ZESTRIL       TAKE these medications    apixaban 5 MG Tabs tablet Commonly known as: ELIQUIS Take 1 tablet (5 mg total) by mouth 2 (two) times daily.   aspirin EC 81 MG tablet Take 81 mg by mouth daily. Swallow whole.   aspirin-acetaminophen-caffeine 250-250-65 MG tablet Commonly known as: EXCEDRIN MIGRAINE Take 1 tablet by mouth every 6 (six) hours as needed for headache.   diltiazem 180 MG 24 hr capsule Commonly known as: CARDIZEM CD Take 1 capsule (180 mg total) by mouth daily. Start taking on: Nov 17, 2022   furosemide 20 MG tablet Commonly known as: LASIX Take 1 tablet (20 mg total) by mouth as needed. PATIENT MUST SCHEDULE APPOINTMENT FOR FUTURE REFILLS FIRST ATTEMPT   multivitamin capsule Take 1 capsule by mouth daily.   nitroGLYCERIN 0.4 MG SL tablet Commonly known as: NITROSTAT Place 1 tablet (0.4 mg total) under the tongue every 5 (five) minutes as needed for chest pain.   Omega 3 1200 MG Caps Take 2,400 mg by mouth daily.           Outstanding Labs/Studies     Duration of Discharge Encounter   Greater than 30 minutes including physician time.  SignedPerlie Gold, PA-C 11/16/2022, 2:16 PM

## 2022-11-16 NOTE — Telephone Encounter (Signed)
Pharmacy Patient Advocate Encounter  Insurance verification completed.    The patient is insured through [employer] [place] [employer]   The patient is currently admitted and ran test claims for the following: Eliquis.  Copays and coinsurance results were relayed to Inpatient clinical team.  

## 2022-11-17 ENCOUNTER — Encounter: Payer: Self-pay | Admitting: Cardiovascular Disease

## 2022-11-26 DIAGNOSIS — M5134 Other intervertebral disc degeneration, thoracic region: Secondary | ICD-10-CM | POA: Diagnosis not present

## 2022-11-26 DIAGNOSIS — M4124 Other idiopathic scoliosis, thoracic region: Secondary | ICD-10-CM | POA: Diagnosis not present

## 2022-11-26 DIAGNOSIS — M9902 Segmental and somatic dysfunction of thoracic region: Secondary | ICD-10-CM | POA: Diagnosis not present

## 2022-11-26 DIAGNOSIS — M5136 Other intervertebral disc degeneration, lumbar region: Secondary | ICD-10-CM | POA: Diagnosis not present

## 2022-12-01 DIAGNOSIS — M5136 Other intervertebral disc degeneration, lumbar region: Secondary | ICD-10-CM | POA: Diagnosis not present

## 2022-12-01 DIAGNOSIS — M9902 Segmental and somatic dysfunction of thoracic region: Secondary | ICD-10-CM | POA: Diagnosis not present

## 2022-12-01 DIAGNOSIS — M4124 Other idiopathic scoliosis, thoracic region: Secondary | ICD-10-CM | POA: Diagnosis not present

## 2022-12-01 DIAGNOSIS — M5134 Other intervertebral disc degeneration, thoracic region: Secondary | ICD-10-CM | POA: Diagnosis not present

## 2022-12-02 ENCOUNTER — Ambulatory Visit (HOSPITAL_COMMUNITY)
Admit: 2022-12-02 | Discharge: 2022-12-02 | Disposition: A | Discharge status: Home / Self Care | Payer: BC Managed Care – PPO | Attending: Internal Medicine | Admitting: Internal Medicine

## 2022-12-02 VITALS — BP 88/74 | HR 78 | Ht 75.0 in | Wt 326.6 lb

## 2022-12-02 DIAGNOSIS — I4891 Unspecified atrial fibrillation: Secondary | ICD-10-CM

## 2022-12-02 DIAGNOSIS — E669 Obesity, unspecified: Secondary | ICD-10-CM | POA: Insufficient documentation

## 2022-12-02 DIAGNOSIS — D6869 Other thrombophilia: Secondary | ICD-10-CM | POA: Diagnosis not present

## 2022-12-02 DIAGNOSIS — I4819 Other persistent atrial fibrillation: Secondary | ICD-10-CM | POA: Diagnosis not present

## 2022-12-02 DIAGNOSIS — E785 Hyperlipidemia, unspecified: Secondary | ICD-10-CM | POA: Insufficient documentation

## 2022-12-02 DIAGNOSIS — Z6841 Body Mass Index (BMI) 40.0 and over, adult: Secondary | ICD-10-CM | POA: Diagnosis not present

## 2022-12-02 DIAGNOSIS — I2510 Atherosclerotic heart disease of native coronary artery without angina pectoris: Secondary | ICD-10-CM | POA: Diagnosis not present

## 2022-12-02 DIAGNOSIS — I10 Essential (primary) hypertension: Secondary | ICD-10-CM | POA: Diagnosis not present

## 2022-12-02 DIAGNOSIS — G4733 Obstructive sleep apnea (adult) (pediatric): Secondary | ICD-10-CM | POA: Insufficient documentation

## 2022-12-02 DIAGNOSIS — I4510 Unspecified right bundle-branch block: Secondary | ICD-10-CM | POA: Insufficient documentation

## 2022-12-02 DIAGNOSIS — Z79899 Other long term (current) drug therapy: Secondary | ICD-10-CM | POA: Diagnosis not present

## 2022-12-02 DIAGNOSIS — Z7982 Long term (current) use of aspirin: Secondary | ICD-10-CM | POA: Diagnosis not present

## 2022-12-02 DIAGNOSIS — F17290 Nicotine dependence, other tobacco product, uncomplicated: Secondary | ICD-10-CM | POA: Diagnosis not present

## 2022-12-02 DIAGNOSIS — Z7901 Long term (current) use of anticoagulants: Secondary | ICD-10-CM | POA: Insufficient documentation

## 2022-12-02 MED ORDER — DILTIAZEM HCL ER COATED BEADS 180 MG PO CP24: 180.0000 mg | ORAL_CAPSULE | Freq: Every day | ORAL | 2 refills | Status: DC

## 2022-12-02 MED ORDER — APIXABAN 5 MG PO TABS: 5.0000 mg | ORAL_TABLET | Freq: Two times a day (BID) | ORAL | 3 refills | Status: DC

## 2022-12-02 NOTE — H&P (View-Only) (Signed)
  Primary Care Physician: Badger, Michael C, MD Primary Cardiologist: Dr. Croitoru Primary Electrophysiologist: None Referring Physician: Evan Williams, PA-C   Edward Obrien is a 51 y.o. 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 The patient was initially diagnosed with atrial fibrillation on 11/15/22 when he presented to [facility] ED in Afib with RVR. Discharged on Cardizem CD 180 mg daily. Patient is on Eliquis 5 mg BID for a CHADS2VASC score of 2.  On evaluation today, he is in rate controlled Afib. He began two full doses of Eliquis on 11/16/22. He can feel palpitations and has been unable to do daily walks due to HR elevating which makes him tired. He does not drink alcohol. He is compliant with CPAP. He drank unsweet tea and it made him have palpitations.   He is compliant with anticoagulation and has not missed any doses. He has no bleeding concerns.  Today, he denies symptoms of chest pain, shortness of breath, orthopnea, PND, lower extremity edema, dizziness, presyncope, syncope, snoring, daytime somnolence, bleeding, or neurologic sequela. The patient is tolerating medications without difficulties and is otherwise without complaint today.   Atrial Fibrillation Risk Factors:  he does have symptoms or diagnosis of sleep apnea. he is compliant with CPAP therapy. he does not have a history of rheumatic fever. he does have a history of alcohol use. The patient does not have a history of early familial atrial fibrillation or other arrhythmias.  he has a BMI of Body mass index is 40.82 kg/m.. Filed Weights   12/02/22 0949  Weight: (!) 148.1 kg    Family History  Problem Relation Age  of Onset   Alzheimer's disease Mother    Hypertension Mother    Diabetes Mother    Failure to thrive Father     Atrial Fibrillation Management history:  Previous antiarrhythmic drugs: None Previous cardioversions: None Previous ablations: None Anticoagulation history: Eliquis 5 mg BID   Past Medical History:  Diagnosis Date   CAD (coronary artery disease) 10/30/2019   mild-mod by cardiac CT>>med rx   HTN (hypertension)    Hyperlipidemia LDL goal <70    OSA on CPAP 11/2019   SVT (supraventricular tachycardia)    Past Surgical History:  Procedure Laterality Date   CARDIAC CATHETERIZATION  10/30/2019   mild-mod dz>>med rx   VASECTOMY      Current Outpatient Medications  Medication Sig Dispense Refill   aspirin EC 81 MG tablet Take 81 mg by mouth daily. Swallow whole.     aspirin-acetaminophen-caffeine (EXCEDRIN MIGRAINE) 250-250-65 MG tablet Take 1 tablet by mouth as needed for headache.     furosemide (LASIX) 20 MG tablet Take 1 tablet (20 mg total) by mouth as needed. PATIENT MUST SCHEDULE APPOINTMENT FOR FUTURE REFILLS FIRST ATTEMPT 30 tablet 0   Multiple Vitamin (MULTIVITAMIN) capsule Take 1 capsule by mouth daily.     nitroGLYCERIN (NITROSTAT) 0.4 MG SL tablet Place 1 tablet (0.4 mg total) under the tongue every 5 (five) minutes as needed for chest pain. 15 tablet 0   Omega 3 1200 MG CAPS Take 2,400 mg by mouth daily.     apixaban (ELIQUIS) 5 MG TABS tablet Take 1 tablet (5 mg total) by mouth 2 (two) times daily.   60 tablet 3   diltiazem (CARDIZEM CD) 180 MG 24 hr capsule Take 1 capsule (180 mg total) by mouth daily. 30 capsule 2   No current facility-administered medications for this encounter.    No Known Allergies  Social History   Socioeconomic History   Marital status: Married    Spouse name: Not on file   Number of children: Not on file   Years of education: Not on file   Highest education level: Not on file  Occupational History   Not on file  Tobacco  Use   Smoking status: Some Days    Types: Cigars   Smokeless tobacco: Never  Substance and Sexual Activity   Alcohol use: Never   Drug use: Never   Sexual activity: Yes  Other Topics Concern   Not on file  Social History Narrative   Not on file   Social Determinants of Health   Financial Resource Strain: Not on file  Food Insecurity: No Food Insecurity (11/15/2022)   Hunger Vital Sign    Worried About Running Out of Food in the Last Year: Never true    Ran Out of Food in the Last Year: Never true  Transportation Needs: No Transportation Needs (11/15/2022)   PRAPARE - Transportation    Lack of Transportation (Medical): No    Lack of Transportation (Non-Medical): No  Physical Activity: Not on file  Stress: Not on file  Social Connections: Not on file  Intimate Partner Violence: Not At Risk (11/15/2022)   Humiliation, Afraid, Rape, and Kick questionnaire    Fear of Current or Ex-Partner: No    Emotionally Abused: No    Physically Abused: No    Sexually Abused: No    ROS- All systems are reviewed and negative except as per the HPI above.  Physical Exam: Vitals:   12/02/22 0949  BP: (!) 88/74  Pulse: 78  Weight: (!) 148.1 kg  Height: 6' 3" (1.905 m)    GEN- The patient is a well appearing male, alert and oriented x 3 today.   Head- normocephalic, atraumatic Eyes-  Sclera clear, conjunctiva pink Ears- hearing intact Oropharynx- clear Neck- supple  Lungs- Clear to ausculation bilaterally, normal work of breathing Heart- Irregular rate and rhythm, no murmurs, rubs or gallops  GI- soft, NT, ND, + BS Extremities- no clubbing, cyanosis, or edema MS- no significant deformity or atrophy Skin- no rash or lesion Psych- euthymic mood, full affect Neuro- strength and sensation are intact  Wt Readings from Last 3 Encounters:  12/02/22 (!) 148.1 kg  11/16/22 (!) 153.5 kg  04/07/22 (!) 146.5 kg    EKG today demonstrates  Vent. rate 78 BPM PR interval * ms QRS duration  160 ms QT/QTcB 422/481 ms P-R-T axes * 107 0 Atrial fibrillation Right bundle branch block Abnormal ECG When compared with ECG of 15-Nov-2022 05:48, PREVIOUS ECG IS PRESENT HEART RATE DECREASED SINCE previous Confirmed by Crenshaw, Brian ([id number]) on 12/02/2022 10:01:05 AM  Echo 11/16/22 demonstrated: 1. Left ventricular ejection fraction, by estimation, is 55 to 60%. The  left ventricle has normal function. The left ventricle has no regional  wall motion abnormalities. The left ventricular internal cavity size was  mildly dilated. Left ventricular  diastolic function could not be evaluated.   2. Right ventricular systolic function is normal. The right ventricular  size is normal.   3. The mitral valve is normal in structure. No evidence of mitral valve  regurgitation. No evidence of mitral stenosis.   4. The   aortic valve is tricuspid. Aortic valve regurgitation is not  visualized. No aortic stenosis is present.   5. Aortic dilatation noted. There is mild dilatation of the aortic root,  measuring 40 mm. There is mild dilatation of the ascending aorta,  measuring 40 mm.   Epic records are reviewed at length today.  CHA2DS2-VASc Score = 2  The patient's score is based upon: CHF History: 0 HTN History: 1 Diabetes History: 0 Stroke History: 0 Vascular Disease History: 1 Age Score: 0 Gender Score: 0       ASSESSMENT AND PLAN: Persistent Atrial Fibrillation (ICD10:  I48.19) The patient's CHA2DS2-VASc score is 2, indicating a 2.2% annual risk of stroke.    Education provided about Afib with visual diagram. Discussion about medication treatments and ablation in detail. After discussion, we will proceed with scheduling DCCV.  Continue Cardizem 180 mg daily. This may have to be reduced after DCCV.   2. Secondary Hypercoagulable State (ICD10:  D68.69) The patient is at significant risk for stroke/thromboembolism based upon his CHA2DS2-VASc Score of 2.  Continue Apixaban (Eliquis).   No missed doses.  3. Obesity Body mass index is 40.82 kg/m. Lifestyle modification was discussed at length including regular exercise and weight reduction. Activity as tolerated.  4. Obstructive sleep apnea The importance of adequate treatment of sleep apnea was discussed today in order to improve our ability to maintain sinus rhythm long term.   Follow up 1-2 weeks after DCCV.    Eldon Zietlow, PA-C [facility] [address] [place], [place] [address] [phone] 12/02/2022 10:36 AM  

## 2022-12-02 NOTE — Progress Notes (Signed)
Primary Care Physician: Eartha Inch, MD Primary Cardiologist: Dr. Royann Shivers Primary Electrophysiologist: None Referring Physician: Perlie Gold, PA-C   Edward Obrien is a 52 y.o. male with a history of morbid obesity, nonobstructive CAD (mild to moderate stenosis of the proximal and mid LAD by CT angiogram 2021), SVT, RBBB+LPFB, hypertension, hyperlipidemia, OSA on CPAP, isolated seizure in 09/2021 in setting of ETOH, and atrial fibrillation who presents for consultation in the [facility]. The patient was initially diagnosed with atrial fibrillation on 11/15/22 when he presented to [facility] ED in Afib with RVR. Discharged on Cardizem CD 180 mg daily. Patient is on Eliquis 5 mg BID for a CHADS2VASC score of 2.  On evaluation today, he is in rate controlled Afib. He began two full doses of Eliquis on 11/16/22. He can feel palpitations and has been unable to do daily walks due to HR elevating which makes him tired. He does not drink alcohol. He is compliant with CPAP. He drank unsweet tea and it made him have palpitations.   He is compliant with anticoagulation and has not missed any doses. He has no bleeding concerns.  Today, he denies symptoms of chest pain, shortness of breath, orthopnea, PND, lower extremity edema, dizziness, presyncope, syncope, snoring, daytime somnolence, bleeding, or neurologic sequela. The patient is tolerating medications without difficulties and is otherwise without complaint today.   Atrial Fibrillation Risk Factors:  he does have symptoms or diagnosis of sleep apnea. he is compliant with CPAP therapy. he does not have a history of rheumatic fever. he does have a history of alcohol use. The patient does not have a history of early familial atrial fibrillation or other arrhythmias.  he has a BMI of Body mass index is 40.82 kg/m.Marland Kitchen Filed Weights   12/02/22 0949  Weight: (!) 148.1 kg    Family History  Problem Relation Age  of Onset   Alzheimer's disease Mother    Hypertension Mother    Diabetes Mother    Failure to thrive Father     Atrial Fibrillation Management history:  Previous antiarrhythmic drugs: None Previous cardioversions: None Previous ablations: None Anticoagulation history: Eliquis 5 mg BID   Past Medical History:  Diagnosis Date   CAD (coronary artery disease) 10/30/2019   mild-mod by cardiac CT>>med rx   HTN (hypertension)    Hyperlipidemia LDL goal <70    OSA on CPAP 11/2019   SVT (supraventricular tachycardia)    Past Surgical History:  Procedure Laterality Date   CARDIAC CATHETERIZATION  10/30/2019   mild-mod dz>>med rx   VASECTOMY      Current Outpatient Medications  Medication Sig Dispense Refill   aspirin EC 81 MG tablet Take 81 mg by mouth daily. Swallow whole.     aspirin-acetaminophen-caffeine (EXCEDRIN MIGRAINE) 250-250-65 MG tablet Take 1 tablet by mouth as needed for headache.     furosemide (LASIX) 20 MG tablet Take 1 tablet (20 mg total) by mouth as needed. PATIENT MUST SCHEDULE APPOINTMENT FOR FUTURE REFILLS FIRST ATTEMPT 30 tablet 0   Multiple Vitamin (MULTIVITAMIN) capsule Take 1 capsule by mouth daily.     nitroGLYCERIN (NITROSTAT) 0.4 MG SL tablet Place 1 tablet (0.4 mg total) under the tongue every 5 (five) minutes as needed for chest pain. 15 tablet 0   Omega 3 1200 MG CAPS Take 2,400 mg by mouth daily.     apixaban (ELIQUIS) 5 MG TABS tablet Take 1 tablet (5 mg total) by mouth 2 (two) times daily.  60 tablet 3   diltiazem (CARDIZEM CD) 180 MG 24 hr capsule Take 1 capsule (180 mg total) by mouth daily. 30 capsule 2   No current facility-administered medications for this encounter.    No Known Allergies  Social History   Socioeconomic History   Marital status: Married    Spouse name: Not on file   Number of children: Not on file   Years of education: Not on file   Highest education level: Not on file  Occupational History   Not on file  Tobacco  Use   Smoking status: Some Days    Types: Cigars   Smokeless tobacco: Never  Substance and Sexual Activity   Alcohol use: Never   Drug use: Never   Sexual activity: Yes  Other Topics Concern   Not on file  Social History Narrative   Not on file   Social Determinants of Health   Financial Resource Strain: Not on file  Food Insecurity: No Food Insecurity (11/15/2022)   Hunger Vital Sign    Worried About Running Out of Food in the Last Year: Never true    Ran Out of Food in the Last Year: Never true  Transportation Needs: No Transportation Needs (11/15/2022)   PRAPARE - Administrator, Civil Service (Medical): No    Lack of Transportation (Non-Medical): No  Physical Activity: Not on file  Stress: Not on file  Social Connections: Not on file  Intimate Partner Violence: Not At Risk (11/15/2022)   Humiliation, Afraid, Rape, and Kick questionnaire    Fear of Current or Ex-Partner: No    Emotionally Abused: No    Physically Abused: No    Sexually Abused: No    ROS- All systems are reviewed and negative except as per the HPI above.  Physical Exam: Vitals:   12/02/22 0949  BP: (!) 88/74  Pulse: 78  Weight: (!) 148.1 kg  Height: 6\' 3"  (1.905 m)    GEN- The patient is a well appearing male, alert and oriented x 3 today.   Head- normocephalic, atraumatic Eyes-  Sclera clear, conjunctiva pink Ears- hearing intact Oropharynx- clear Neck- supple  Lungs- Clear to ausculation bilaterally, normal work of breathing Heart- Irregular rate and rhythm, no murmurs, rubs or gallops  GI- soft, NT, ND, + BS Extremities- no clubbing, cyanosis, or edema MS- no significant deformity or atrophy Skin- no rash or lesion Psych- euthymic mood, full affect Neuro- strength and sensation are intact  Wt Readings from Last 3 Encounters:  12/02/22 (!) 148.1 kg  11/16/22 (!) 153.5 kg  04/07/22 (!) 146.5 kg    EKG today demonstrates  Vent. rate 78 BPM PR interval * ms QRS duration  160 ms QT/QTcB 422/481 ms P-R-T axes * 107 0 Atrial fibrillation Right bundle branch block Abnormal ECG When compared with ECG of 15-Nov-2022 05:48, PREVIOUS ECG IS PRESENT HEART RATE DECREASED SINCE previous Confirmed by Olga Millers ([id number]) on 12/02/2022 10:01:05 AM  Echo 11/16/22 demonstrated: 1. Left ventricular ejection fraction, by estimation, is 55 to 60%. The  left ventricle has normal function. The left ventricle has no regional  wall motion abnormalities. The left ventricular internal cavity size was  mildly dilated. Left ventricular  diastolic function could not be evaluated.   2. Right ventricular systolic function is normal. The right ventricular  size is normal.   3. The mitral valve is normal in structure. No evidence of mitral valve  regurgitation. No evidence of mitral stenosis.   4. The  aortic valve is tricuspid. Aortic valve regurgitation is not  visualized. No aortic stenosis is present.   5. Aortic dilatation noted. There is mild dilatation of the aortic root,  measuring 40 mm. There is mild dilatation of the ascending aorta,  measuring 40 mm.   Epic records are reviewed at length today.  CHA2DS2-VASc Score = 2  The patient's score is based upon: CHF History: 0 HTN History: 1 Diabetes History: 0 Stroke History: 0 Vascular Disease History: 1 Age Score: 0 Gender Score: 0       ASSESSMENT AND PLAN: Persistent Atrial Fibrillation (ICD10:  I48.19) The patient's CHA2DS2-VASc score is 2, indicating a 2.2% annual risk of stroke.    Education provided about Afib with visual diagram. Discussion about medication treatments and ablation in detail. After discussion, we will proceed with scheduling DCCV.  Continue Cardizem 180 mg daily. This may have to be reduced after DCCV.   2. Secondary Hypercoagulable State (ICD10:  D68.69) The patient is at significant risk for stroke/thromboembolism based upon his CHA2DS2-VASc Score of 2.  Continue Apixaban (Eliquis).   No missed doses.  3. Obesity Body mass index is 40.82 kg/m. Lifestyle modification was discussed at length including regular exercise and weight reduction. Activity as tolerated.  4. Obstructive sleep apnea The importance of adequate treatment of sleep apnea was discussed today in order to improve our ability to maintain sinus rhythm long term.   Follow up 1-2 weeks after DCCV.    Lake Bells, PA-C Afib Clinic [facility] [address] [place], [place] [address] [phone] 12/02/2022 10:36 AM

## 2022-12-02 NOTE — Patient Instructions (Signed)
Stop aspirin  Cardioversion scheduled for: Thursday, June 6th   - Arrive at the [facility] and go to admitting at 1100AM   - Do not eat or drink anything after midnight the night prior to your procedure.   - Take all your morning medication (except diabetic medications) with a sip of water prior to arrival.  - You will not be able to drive home after your procedure.    - Do NOT miss any doses of your blood thinner - if you should miss a dose please notify our office immediately.   - If you feel as if you go back into normal rhythm prior to scheduled cardioversion, please notify our office immediately.   If your procedure is canceled in the cardioversion suite you will be charged a cancellation fee.

## 2022-12-03 DIAGNOSIS — M5136 Other intervertebral disc degeneration, lumbar region: Secondary | ICD-10-CM | POA: Diagnosis not present

## 2022-12-03 DIAGNOSIS — M9902 Segmental and somatic dysfunction of thoracic region: Secondary | ICD-10-CM | POA: Diagnosis not present

## 2022-12-03 DIAGNOSIS — M5134 Other intervertebral disc degeneration, thoracic region: Secondary | ICD-10-CM | POA: Diagnosis not present

## 2022-12-03 DIAGNOSIS — M4124 Other idiopathic scoliosis, thoracic region: Secondary | ICD-10-CM | POA: Diagnosis not present

## 2022-12-07 DIAGNOSIS — M9902 Segmental and somatic dysfunction of thoracic region: Secondary | ICD-10-CM | POA: Diagnosis not present

## 2022-12-07 DIAGNOSIS — M5134 Other intervertebral disc degeneration, thoracic region: Secondary | ICD-10-CM | POA: Diagnosis not present

## 2022-12-07 DIAGNOSIS — M5136 Other intervertebral disc degeneration, lumbar region: Secondary | ICD-10-CM | POA: Diagnosis not present

## 2022-12-07 DIAGNOSIS — M4124 Other idiopathic scoliosis, thoracic region: Secondary | ICD-10-CM | POA: Diagnosis not present

## 2022-12-08 DIAGNOSIS — M5136 Other intervertebral disc degeneration, lumbar region: Secondary | ICD-10-CM | POA: Diagnosis not present

## 2022-12-08 DIAGNOSIS — M5134 Other intervertebral disc degeneration, thoracic region: Secondary | ICD-10-CM | POA: Diagnosis not present

## 2022-12-08 DIAGNOSIS — M4124 Other idiopathic scoliosis, thoracic region: Secondary | ICD-10-CM | POA: Diagnosis not present

## 2022-12-08 DIAGNOSIS — M9902 Segmental and somatic dysfunction of thoracic region: Secondary | ICD-10-CM | POA: Diagnosis not present

## 2022-12-09 DIAGNOSIS — M9902 Segmental and somatic dysfunction of thoracic region: Secondary | ICD-10-CM | POA: Diagnosis not present

## 2022-12-09 DIAGNOSIS — M5136 Other intervertebral disc degeneration, lumbar region: Secondary | ICD-10-CM | POA: Diagnosis not present

## 2022-12-09 DIAGNOSIS — M4124 Other idiopathic scoliosis, thoracic region: Secondary | ICD-10-CM | POA: Diagnosis not present

## 2022-12-09 DIAGNOSIS — M5134 Other intervertebral disc degeneration, thoracic region: Secondary | ICD-10-CM | POA: Diagnosis not present

## 2022-12-09 NOTE — Pre-Procedure Instructions (Signed)
Spoke to patient on phone regarding cardioversion tomorrow - arrive at 1130, NPO after midnight, confirmed ride home and responsible person to stay with patient for 24 hours after, confirmed no missed doses of eliquis and instructed patient to take it and cardizem in the morning with a sip of water

## 2022-12-10 ENCOUNTER — Encounter (HOSPITAL_COMMUNITY): Payer: Self-pay | Admitting: Cardiology

## 2022-12-10 ENCOUNTER — Encounter (HOSPITAL_COMMUNITY)
Admission: RE | Disposition: A | Discharge status: Home / Self Care | Payer: Self-pay | Source: Home / Self Care | Attending: Cardiology

## 2022-12-10 ENCOUNTER — Ambulatory Visit (HOSPITAL_COMMUNITY): Payer: BC Managed Care – PPO | Admitting: Certified Registered Nurse Anesthetist

## 2022-12-10 ENCOUNTER — Ambulatory Visit (HOSPITAL_COMMUNITY)
Admission: RE | Admit: 2022-12-10 | Discharge: 2022-12-10 | Disposition: A | Discharge status: Home / Self Care | Payer: BC Managed Care – PPO | Attending: Cardiology | Admitting: Cardiology

## 2022-12-10 ENCOUNTER — Other Ambulatory Visit: Payer: Self-pay

## 2022-12-10 DIAGNOSIS — I2510 Atherosclerotic heart disease of native coronary artery without angina pectoris: Secondary | ICD-10-CM | POA: Diagnosis not present

## 2022-12-10 DIAGNOSIS — G4733 Obstructive sleep apnea (adult) (pediatric): Secondary | ICD-10-CM | POA: Insufficient documentation

## 2022-12-10 DIAGNOSIS — I4891 Unspecified atrial fibrillation: Secondary | ICD-10-CM | POA: Diagnosis not present

## 2022-12-10 DIAGNOSIS — I4819 Other persistent atrial fibrillation: Secondary | ICD-10-CM | POA: Insufficient documentation

## 2022-12-10 DIAGNOSIS — D6869 Other thrombophilia: Secondary | ICD-10-CM | POA: Insufficient documentation

## 2022-12-10 DIAGNOSIS — I10 Essential (primary) hypertension: Secondary | ICD-10-CM | POA: Diagnosis not present

## 2022-12-10 DIAGNOSIS — Z79899 Other long term (current) drug therapy: Secondary | ICD-10-CM | POA: Insufficient documentation

## 2022-12-10 DIAGNOSIS — Z6841 Body Mass Index (BMI) 40.0 and over, adult: Secondary | ICD-10-CM | POA: Insufficient documentation

## 2022-12-10 DIAGNOSIS — Z7901 Long term (current) use of anticoagulants: Secondary | ICD-10-CM | POA: Diagnosis not present

## 2022-12-10 DIAGNOSIS — F17200 Nicotine dependence, unspecified, uncomplicated: Secondary | ICD-10-CM | POA: Diagnosis not present

## 2022-12-10 DIAGNOSIS — E669 Obesity, unspecified: Secondary | ICD-10-CM | POA: Diagnosis not present

## 2022-12-10 DIAGNOSIS — Z87891 Personal history of nicotine dependence: Secondary | ICD-10-CM | POA: Diagnosis not present

## 2022-12-10 HISTORY — PX: CARDIOVERSION: SHX1299

## 2022-12-10 SURGERY — CARDIOVERSION
Anesthesia: General

## 2022-12-10 MED ORDER — PROPOFOL 10 MG/ML IV BOLUS
INTRAVENOUS | Status: DC | PRN
  Administered 2022-12-10 (×2): 100 mg via INTRAVENOUS

## 2022-12-10 MED ORDER — SODIUM CHLORIDE 0.9 % IV SOLN: INTRAVENOUS | Status: DC

## 2022-12-10 MED ORDER — LIDOCAINE 2% (20 MG/ML) 5 ML SYRINGE
INTRAMUSCULAR | Status: DC | PRN
  Administered 2022-12-10: 80 mg via INTRAVENOUS

## 2022-12-10 SURGICAL SUPPLY — 1 items: ELECT DEFIB PAD ADLT CADENCE (PAD) ×1 IMPLANT

## 2022-12-10 NOTE — Interval H&P Note (Signed)
History and Physical Interval Note:  12/10/2022 12:54 PM  Edward Obrien  has presented today for surgery, with the diagnosis of afib.  The various methods of treatment have been discussed with the patient and family. After consideration of risks, benefits and other options for treatment, the patient has consented to  Procedure(s): CARDIOVERSION (N/A) as a surgical intervention.  The patient's history has been reviewed, patient examined, no change in status, stable for surgery.  I have reviewed the patient's chart and labs.  Questions were answered to the patient's satisfaction.     [employer]

## 2022-12-10 NOTE — Transfer of Care (Signed)
Immediate Anesthesia Transfer of Care Note  Patient: Edward Obrien  Procedure(s) Performed: CARDIOVERSION  Patient Location: Cath Lab  Anesthesia Type:MAC  Level of Consciousness: awake, alert , and oriented  Airway & Oxygen Therapy: Patient Spontanous Breathing and Patient connected to nasal cannula oxygen  Post-op Assessment: Report given to RN and Post -op Vital signs reviewed and stable  Post vital signs: Reviewed and stable  Last Vitals:  Vitals Value Taken Time  BP 126/89   Temp 98   Pulse 61   Resp 16   SpO2 97     Last Pain:  Vitals:   12/10/22 1211  TempSrc:   PainSc: 0-No pain         Complications: No notable events documented.

## 2022-12-10 NOTE — Discharge Instructions (Signed)

## 2022-12-10 NOTE — CV Procedure (Signed)
    Electrical Cardioversion Procedure Note Edward Obrien [id number] 02-12-1971  Procedure: Electrical Cardioversion Indications:  Atrial Fibrillation  Time Out: Verified patient identification, verified procedure,medications/allergies/relevent history reviewed, required imaging and test results available.  Performed  Procedure Details  The patient was NPO after midnight. Anesthesia was administered at the beside  by Dr.Hodierne with propofol.  Cardioversion was performed with synchronized biphasic defibrillation via AP pads with 200 joules.  2 attempt(s) were performed (second with towel compression on chest wall).  The patient converted to normal sinus rhythm. The patient tolerated the procedure well   IMPRESSION:  Successful cardioversion of atrial fibrillation    Edward Obrien 12/10/2022, 1:12 PM

## 2022-12-11 ENCOUNTER — Encounter (HOSPITAL_COMMUNITY): Payer: Self-pay | Admitting: Cardiology

## 2022-12-14 ENCOUNTER — Encounter (HOSPITAL_COMMUNITY): Payer: Self-pay | Admitting: Cardiology

## 2022-12-14 DIAGNOSIS — M4124 Other idiopathic scoliosis, thoracic region: Secondary | ICD-10-CM | POA: Diagnosis not present

## 2022-12-14 DIAGNOSIS — M9902 Segmental and somatic dysfunction of thoracic region: Secondary | ICD-10-CM | POA: Diagnosis not present

## 2022-12-14 DIAGNOSIS — M5134 Other intervertebral disc degeneration, thoracic region: Secondary | ICD-10-CM | POA: Diagnosis not present

## 2022-12-14 DIAGNOSIS — M5136 Other intervertebral disc degeneration, lumbar region: Secondary | ICD-10-CM | POA: Diagnosis not present

## 2022-12-14 NOTE — Anesthesia Postprocedure Evaluation (Signed)
Anesthesia Post Note  Patient: Edward Obrien  Procedure(s) Performed: CARDIOVERSION     Patient location during evaluation: Cath Lab Anesthesia Type: General Level of consciousness: awake and alert Pain management: pain level controlled Vital Signs Assessment: post-procedure vital signs reviewed and stable Respiratory status: spontaneous breathing, nonlabored ventilation, respiratory function stable and patient connected to nasal cannula oxygen Cardiovascular status: blood pressure returned to baseline and stable Postop Assessment: no apparent nausea or vomiting Anesthetic complications: no   No notable events documented.  Last Vitals:  Vitals:   12/10/22 1332 12/10/22 1342  BP: (!) 133/93 (!) 142/96  Pulse: (!) 52 (!) 54  Resp: 13 15  Temp:    SpO2: 97% 98%    Last Pain:  Vitals:   12/10/22 1342  TempSrc:   PainSc: 0-No pain                 [name] S

## 2022-12-14 NOTE — Anesthesia Preprocedure Evaluation (Signed)
Anesthesia Evaluation  Patient identified by MRN, date of birth, ID band Patient awake    Reviewed: Allergy & Precautions, H&P , NPO status , Patient's Chart, lab work & pertinent test results  Airway Mallampati: II   Neck ROM: full    Dental   Pulmonary sleep apnea , Current Smoker   breath sounds clear to auscultation       Cardiovascular hypertension, + CAD  + dysrhythmias Atrial Fibrillation  Rhythm:irregular Rate:Normal     Neuro/Psych    GI/Hepatic   Endo/Other    Renal/GU      Musculoskeletal   Abdominal   Peds  Hematology   Anesthesia Other Findings   Reproductive/Obstetrics                              Anesthesia Physical Anesthesia Plan  ASA: 3  Anesthesia Plan: General   Post-op Pain Management:    Induction: Intravenous  PONV Risk Score and Plan: 1 and Propofol infusion and Treatment may vary due to age or medical condition  Airway Management Planned: Mask  Additional Equipment:   Intra-op Plan:   Post-operative Plan:   Informed Consent: I have reviewed the patients History and Physical, chart, labs and discussed the procedure including the risks, benefits and alternatives for the proposed anesthesia with the patient or authorized representative who has indicated his/her understanding and acceptance.     Dental advisory given  Plan Discussed with: CRNA, Anesthesiologist and Surgeon  Anesthesia Plan Comments:          Anesthesia Quick Evaluation

## 2022-12-15 DIAGNOSIS — M5136 Other intervertebral disc degeneration, lumbar region: Secondary | ICD-10-CM | POA: Diagnosis not present

## 2022-12-15 DIAGNOSIS — M5134 Other intervertebral disc degeneration, thoracic region: Secondary | ICD-10-CM | POA: Diagnosis not present

## 2022-12-15 DIAGNOSIS — M4124 Other idiopathic scoliosis, thoracic region: Secondary | ICD-10-CM | POA: Diagnosis not present

## 2022-12-15 DIAGNOSIS — M9902 Segmental and somatic dysfunction of thoracic region: Secondary | ICD-10-CM | POA: Diagnosis not present

## 2022-12-18 ENCOUNTER — Encounter (HOSPITAL_COMMUNITY): Payer: Self-pay | Admitting: Internal Medicine

## 2022-12-18 ENCOUNTER — Ambulatory Visit (HOSPITAL_COMMUNITY)
Admission: RE | Admit: 2022-12-18 | Discharge: 2022-12-18 | Disposition: A | Discharge status: Home / Self Care | Payer: BC Managed Care – PPO | Source: Ambulatory Visit | Attending: Internal Medicine | Admitting: Internal Medicine

## 2022-12-18 VITALS — BP 124/64 | HR 70 | Ht 76.0 in | Wt 318.6 lb

## 2022-12-18 DIAGNOSIS — Z8249 Family history of ischemic heart disease and other diseases of the circulatory system: Secondary | ICD-10-CM | POA: Insufficient documentation

## 2022-12-18 DIAGNOSIS — F17290 Nicotine dependence, other tobacco product, uncomplicated: Secondary | ICD-10-CM | POA: Insufficient documentation

## 2022-12-18 DIAGNOSIS — G4733 Obstructive sleep apnea (adult) (pediatric): Secondary | ICD-10-CM | POA: Diagnosis not present

## 2022-12-18 DIAGNOSIS — Z79899 Other long term (current) drug therapy: Secondary | ICD-10-CM | POA: Insufficient documentation

## 2022-12-18 DIAGNOSIS — Z6838 Body mass index (BMI) 38.0-38.9, adult: Secondary | ICD-10-CM | POA: Insufficient documentation

## 2022-12-18 DIAGNOSIS — I4819 Other persistent atrial fibrillation: Secondary | ICD-10-CM | POA: Diagnosis not present

## 2022-12-18 DIAGNOSIS — D6869 Other thrombophilia: Secondary | ICD-10-CM | POA: Insufficient documentation

## 2022-12-18 DIAGNOSIS — E669 Obesity, unspecified: Secondary | ICD-10-CM | POA: Insufficient documentation

## 2022-12-18 DIAGNOSIS — I4891 Unspecified atrial fibrillation: Secondary | ICD-10-CM

## 2022-12-18 DIAGNOSIS — Z7901 Long term (current) use of anticoagulants: Secondary | ICD-10-CM | POA: Insufficient documentation

## 2022-12-18 NOTE — Progress Notes (Signed)
Primary Care Physician: Eartha Inch, MD Primary Cardiologist: Dr. Royann Shivers Primary Electrophysiologist: None Referring Physician: Perlie Gold, PA-C   Edward Obrien is a 52 y.o. male with a history of morbid obesity, nonobstructive CAD (mild to moderate stenosis of the proximal and mid LAD by CT angiogram 2021), SVT, RBBB+LPFB, hypertension, hyperlipidemia, OSA on CPAP, isolated seizure in 09/2021 in setting of ETOH, and atrial fibrillation who presents for consultation in the [facility]. The patient was initially diagnosed with atrial fibrillation on 11/15/22 when he presented to [facility] ED in Afib with RVR. Discharged on Cardizem CD 180 mg daily. Patient is on Eliquis 5 mg BID for a CHADS2VASC score of 2.  On evaluation today, he is in rate controlled Afib. He began two full doses of Eliquis on 11/16/22. He can feel palpitations and has been unable to do daily walks due to HR elevating which makes him tired. He does not drink alcohol. He is compliant with CPAP. He drank unsweet tea and it made him have palpitations.   He is compliant with anticoagulation and has not missed any doses. He has no bleeding concerns.  On follow up 12/18/22, patient is currently in NSR. He is s/p successful DCCV on 12/10/22. Since then, he feels well overall. He is interested in the [place] procedure. No episodes of Afib since DCCV. He has not missed doses of anticoagulant.   Today, he denies symptoms of chest pain, shortness of breath, orthopnea, PND, lower extremity edema, dizziness, presyncope, syncope, snoring, daytime somnolence, bleeding, or neurologic sequela. The patient is tolerating medications without difficulties and is otherwise without complaint today.   Atrial Fibrillation Risk Factors:  he does have symptoms or diagnosis of sleep apnea. he is compliant with CPAP therapy. he does not have a history of rheumatic fever. he does have a history of alcohol use. The  patient does not have a history of early familial atrial fibrillation or other arrhythmias.  he has a BMI of Body mass index is 38.78 kg/m.Marland Kitchen Filed Weights   12/18/22 0916  Weight: (!) 144.5 kg     Family History  Problem Relation Age of Onset   Alzheimer's disease Mother    Hypertension Mother    Diabetes Mother    Failure to thrive Father     Atrial Fibrillation Management history:  Previous antiarrhythmic drugs: None Previous cardioversions: 12/10/22 Previous ablations: None Anticoagulation history: Eliquis 5 mg BID   Past Medical History:  Diagnosis Date   CAD (coronary artery disease) 10/30/2019   mild-mod by cardiac CT>>med rx   HTN (hypertension)    Hyperlipidemia LDL goal <70    OSA on CPAP 11/2019   SVT (supraventricular tachycardia)    Past Surgical History:  Procedure Laterality Date   CARDIAC CATHETERIZATION  10/30/2019   mild-mod dz>>med rx   CARDIOVERSION N/A 12/10/2022   Procedure: CARDIOVERSION;  Surgeon: Jake Bathe, MD;  Location: [facility] INVASIVE CV LAB;  Service: Cardiovascular;  Laterality: N/A;   VASECTOMY      Current Outpatient Medications  Medication Sig Dispense Refill   apixaban (ELIQUIS) 5 MG TABS tablet Take 1 tablet (5 mg total) by mouth 2 (two) times daily. 60 tablet 3   carboxymethylcellulose (REFRESH TEARS) 0.5 % SOLN Place 1 drop into both eyes daily as needed (dry eyes).     diltiazem (CARDIZEM CD) 180 MG 24 hr capsule Take 1 capsule (180 mg total) by mouth daily. 30 capsule 2   furosemide (LASIX) 20  MG tablet Take 1 tablet (20 mg total) by mouth as needed. PATIENT MUST SCHEDULE APPOINTMENT FOR FUTURE REFILLS FIRST ATTEMPT 30 tablet 0   nitroGLYCERIN (NITROSTAT) 0.4 MG SL tablet Place 1 tablet (0.4 mg total) under the tongue every 5 (five) minutes as needed for chest pain. 15 tablet 0   Omega 3 1200 MG CAPS Take 1,200 mg by mouth daily.     Podiatric Products (HEEL BALM EX) Apply 1 Application topically at bedtime as needed (cracked  skin).     aspirin-acetaminophen-caffeine (EXCEDRIN MIGRAINE) 250-250-65 MG tablet Take 2 tablets by mouth as needed for headache. (Patient not taking: Reported on 12/18/2022)     No current facility-administered medications for this encounter.    No Known Allergies  Social History   Socioeconomic History   Marital status: Married    Spouse name: Not on file   Number of children: Not on file   Years of education: Not on file   Highest education level: Not on file  Occupational History   Not on file  Tobacco Use   Smoking status: Some Days    Types: Cigars   Smokeless tobacco: Never  Substance and Sexual Activity   Alcohol use: Never   Drug use: Never   Sexual activity: Yes  Other Topics Concern   Not on file  Social History Narrative   Not on file   Social Determinants of Health   Financial Resource Strain: Not on file  Food Insecurity: No Food Insecurity (11/15/2022)   Hunger Vital Sign    Worried About Running Out of Food in the Last Year: Never true    Ran Out of Food in the Last Year: Never true  Transportation Needs: No Transportation Needs (11/15/2022)   PRAPARE - Administrator, Civil Service (Medical): No    Lack of Transportation (Non-Medical): No  Physical Activity: Not on file  Stress: Not on file  Social Connections: Not on file  Intimate Partner Violence: Not At Risk (11/15/2022)   Humiliation, Afraid, Rape, and Kick questionnaire    Fear of Current or Ex-Partner: No    Emotionally Abused: No    Physically Abused: No    Sexually Abused: No    ROS- All systems are reviewed and negative except as per the HPI above.  Physical Exam: Vitals:   12/18/22 0916  BP: 124/64  Pulse: 70  Weight: (!) 144.5 kg  Height: 6\' 4"  (1.93 m)    GEN- The patient is well appearing, alert and oriented x 3 today.   Head- normocephalic, atraumatic Eyes-  Sclera clear, conjunctiva pink Ears- hearing intact Lungs- Clear to ausculation bilaterally, normal  work of breathing Heart- Regular rate and rhythm, no murmurs, rubs or gallops, PMI not laterally displaced Extremities- no clubbing, cyanosis, or edema MS- no significant deformity or atrophy Skin- no rash or lesion Psych- euthymic mood, full affect Neuro- strength and sensation are intact   Wt Readings from Last 3 Encounters:  12/18/22 (!) 144.5 kg  12/10/22 (!) 145.2 kg  12/02/22 (!) 148.1 kg    EKG today demonstrates  NSR RBBB Left posterior fascicular block HR 70 PR 176 ms QRS 158 ms QT/Qtc 424/457 ms  Echo 11/16/22 demonstrated: 1. Left ventricular ejection fraction, by estimation, is 55 to 60%. The  left ventricle has normal function. The left ventricle has no regional  wall motion abnormalities. The left ventricular internal cavity size was  mildly dilated. Left ventricular  diastolic function could not be evaluated.  2. Right ventricular systolic function is normal. The right ventricular  size is normal.   3. The mitral valve is normal in structure. No evidence of mitral valve  regurgitation. No evidence of mitral stenosis.   4. The aortic valve is tricuspid. Aortic valve regurgitation is not  visualized. No aortic stenosis is present.   5. Aortic dilatation noted. There is mild dilatation of the aortic root,  measuring 40 mm. There is mild dilatation of the ascending aorta,  measuring 40 mm.   Epic records are reviewed at length today.  CHA2DS2-VASc Score = 2  The patient's score is based upon: CHF History: 0 HTN History: 1 Diabetes History: 0 Stroke History: 0 Vascular Disease History: 1 Age Score: 0 Gender Score: 0      ASSESSMENT AND PLAN: Persistent Atrial Fibrillation (ICD10:  I48.19) The patient's CHA2DS2-VASc score is 2, indicating a 2.2% annual risk of stroke.    S/p DCCV on 12/10/22.   Continue Cardizem 180 mg daily.   Will schedule appt with Dr. Lalla Brothers to discuss Watchman procedure.   2. Secondary Hypercoagulable State (ICD10:   D68.69) The patient is at significant risk for stroke/thromboembolism based upon his CHA2DS2-VASc Score of 2.  Continue Apixaban (Eliquis).  No missed doses.  3. Obesity Body mass index is 38.78 kg/m. Lifestyle modification was discussed at length including regular exercise and weight reduction. Activity as tolerated.  4. Obstructive sleep apnea The importance of adequate treatment of sleep apnea was discussed today in order to improve our ability to maintain sinus rhythm long term.   Will schedule with Dr. Lalla Brothers to discuss Watchman procedure.   Lake Bells, PA-C Afib Clinic [facility] [address] [place], [place] [address] [phone] 12/18/2022 11:09 AM

## 2022-12-21 DIAGNOSIS — M9902 Segmental and somatic dysfunction of thoracic region: Secondary | ICD-10-CM | POA: Diagnosis not present

## 2022-12-21 DIAGNOSIS — M5134 Other intervertebral disc degeneration, thoracic region: Secondary | ICD-10-CM | POA: Diagnosis not present

## 2022-12-21 DIAGNOSIS — M5136 Other intervertebral disc degeneration, lumbar region: Secondary | ICD-10-CM | POA: Diagnosis not present

## 2022-12-21 DIAGNOSIS — M4124 Other idiopathic scoliosis, thoracic region: Secondary | ICD-10-CM | POA: Diagnosis not present

## 2022-12-22 DIAGNOSIS — M5134 Other intervertebral disc degeneration, thoracic region: Secondary | ICD-10-CM | POA: Diagnosis not present

## 2022-12-22 DIAGNOSIS — M9902 Segmental and somatic dysfunction of thoracic region: Secondary | ICD-10-CM | POA: Diagnosis not present

## 2022-12-22 DIAGNOSIS — M5136 Other intervertebral disc degeneration, lumbar region: Secondary | ICD-10-CM | POA: Diagnosis not present

## 2022-12-22 DIAGNOSIS — M4124 Other idiopathic scoliosis, thoracic region: Secondary | ICD-10-CM | POA: Diagnosis not present

## 2022-12-24 DIAGNOSIS — M5134 Other intervertebral disc degeneration, thoracic region: Secondary | ICD-10-CM | POA: Diagnosis not present

## 2022-12-24 DIAGNOSIS — M5136 Other intervertebral disc degeneration, lumbar region: Secondary | ICD-10-CM | POA: Diagnosis not present

## 2022-12-24 DIAGNOSIS — M4124 Other idiopathic scoliosis, thoracic region: Secondary | ICD-10-CM | POA: Diagnosis not present

## 2022-12-24 DIAGNOSIS — M9902 Segmental and somatic dysfunction of thoracic region: Secondary | ICD-10-CM | POA: Diagnosis not present

## 2022-12-28 DIAGNOSIS — M5134 Other intervertebral disc degeneration, thoracic region: Secondary | ICD-10-CM | POA: Diagnosis not present

## 2022-12-28 DIAGNOSIS — M5136 Other intervertebral disc degeneration, lumbar region: Secondary | ICD-10-CM | POA: Diagnosis not present

## 2022-12-28 DIAGNOSIS — J029 Acute pharyngitis, unspecified: Secondary | ICD-10-CM | POA: Diagnosis not present

## 2022-12-28 DIAGNOSIS — R051 Acute cough: Secondary | ICD-10-CM | POA: Diagnosis not present

## 2022-12-28 DIAGNOSIS — M4124 Other idiopathic scoliosis, thoracic region: Secondary | ICD-10-CM | POA: Diagnosis not present

## 2022-12-28 DIAGNOSIS — M9902 Segmental and somatic dysfunction of thoracic region: Secondary | ICD-10-CM | POA: Diagnosis not present

## 2022-12-28 DIAGNOSIS — U071 COVID-19: Secondary | ICD-10-CM | POA: Diagnosis not present

## 2022-12-29 ENCOUNTER — Encounter: Payer: Self-pay | Admitting: Cardiovascular Disease

## 2023-01-12 DIAGNOSIS — M4124 Other idiopathic scoliosis, thoracic region: Secondary | ICD-10-CM | POA: Diagnosis not present

## 2023-01-12 DIAGNOSIS — M5134 Other intervertebral disc degeneration, thoracic region: Secondary | ICD-10-CM | POA: Diagnosis not present

## 2023-01-12 DIAGNOSIS — M5136 Other intervertebral disc degeneration, lumbar region: Secondary | ICD-10-CM | POA: Diagnosis not present

## 2023-01-12 DIAGNOSIS — M9902 Segmental and somatic dysfunction of thoracic region: Secondary | ICD-10-CM | POA: Diagnosis not present

## 2023-01-14 DIAGNOSIS — M4124 Other idiopathic scoliosis, thoracic region: Secondary | ICD-10-CM | POA: Diagnosis not present

## 2023-01-14 DIAGNOSIS — M9902 Segmental and somatic dysfunction of thoracic region: Secondary | ICD-10-CM | POA: Diagnosis not present

## 2023-01-14 DIAGNOSIS — M5136 Other intervertebral disc degeneration, lumbar region: Secondary | ICD-10-CM | POA: Diagnosis not present

## 2023-01-14 DIAGNOSIS — M5134 Other intervertebral disc degeneration, thoracic region: Secondary | ICD-10-CM | POA: Diagnosis not present

## 2023-01-18 DIAGNOSIS — M5136 Other intervertebral disc degeneration, lumbar region: Secondary | ICD-10-CM | POA: Diagnosis not present

## 2023-01-18 DIAGNOSIS — M5134 Other intervertebral disc degeneration, thoracic region: Secondary | ICD-10-CM | POA: Diagnosis not present

## 2023-01-18 DIAGNOSIS — M9902 Segmental and somatic dysfunction of thoracic region: Secondary | ICD-10-CM | POA: Diagnosis not present

## 2023-01-18 DIAGNOSIS — M4124 Other idiopathic scoliosis, thoracic region: Secondary | ICD-10-CM | POA: Diagnosis not present

## 2023-01-20 DIAGNOSIS — M5136 Other intervertebral disc degeneration, lumbar region: Secondary | ICD-10-CM | POA: Diagnosis not present

## 2023-01-20 DIAGNOSIS — M5134 Other intervertebral disc degeneration, thoracic region: Secondary | ICD-10-CM | POA: Diagnosis not present

## 2023-01-20 DIAGNOSIS — M4124 Other idiopathic scoliosis, thoracic region: Secondary | ICD-10-CM | POA: Diagnosis not present

## 2023-01-20 DIAGNOSIS — M9902 Segmental and somatic dysfunction of thoracic region: Secondary | ICD-10-CM | POA: Diagnosis not present

## 2023-01-21 ENCOUNTER — Encounter: Payer: Self-pay | Admitting: Cardiology

## 2023-01-21 ENCOUNTER — Ambulatory Visit: Payer: BC Managed Care – PPO | Attending: Cardiology | Admitting: Cardiology

## 2023-01-21 VITALS — BP 122/78 | HR 49 | Ht 76.0 in | Wt 315.0 lb

## 2023-01-21 DIAGNOSIS — G4733 Obstructive sleep apnea (adult) (pediatric): Secondary | ICD-10-CM

## 2023-01-21 DIAGNOSIS — I4819 Other persistent atrial fibrillation: Secondary | ICD-10-CM | POA: Diagnosis not present

## 2023-01-21 DIAGNOSIS — I4891 Unspecified atrial fibrillation: Secondary | ICD-10-CM | POA: Diagnosis not present

## 2023-01-21 NOTE — Progress Notes (Signed)
Electrophysiology Office Note:    Date:  01/21/2023   ID:  Edward Obrien, DOB 1970/08/09, MRN [id number]  [facility] Cardiologist:  None  [facility] Electrophysiologist:  Lanier Prude, MD   Referring MD: Eartha Inch, MD   Chief Complaint: Atrial fibrillation  History of Present Illness:    Edward Obrien is a 52 y.o. malewho I am seeing today for an evaluation of fibrillation at the request of Charlann Lange.  The patient was last seen by Jonny Ruiz on December 18, 2022.  The patient has a medical history that includes obesity, nonobstructive coronary artery disease, SVT, right bundle branch block, hypertension, hyperlipidemia, obstructive sleep apnea on CPAP, atrial fibrillation.  He was diagnosed with A-fib in May of this year when he presented to the ER in atrial fibrillation with rapid ventricular rates.  He was started on Eliquis for stroke prophylaxis.  He had a cardioversion on December 10, 2022.  He is with his wife today in clinic.  He rides motorcycles and lives an active lifestyle.  He is concerned about the elevated risk of bleeding while on a blood thinner should he injure himself.  He is interested in proceeding with watchman if he is a candidate.         Their past medical, social and family history was reveiwed.   ROS:   Please see the history of present illness.    All other systems reviewed and are negative.  EKGs/Labs/Other Studies Reviewed:    The following studies were reviewed today:   EKG Interpretation Date/Time:  Thursday January 21 2023 10:57:17 EDT Ventricular Rate:  49 PR Interval:  204 QRS Duration:  160 QT Interval:  470 QTC Calculation: 424 R Axis:   98  Text Interpretation: Sinus bradycardia Rightward axis Confirmed by Steffanie Dunn [phone]) on 01/21/2023 11:10:15 AM    Physical Exam:    VS:  BP 122/78   Pulse (!) 49   Ht 6\' 4"  (1.93 m)   Wt (!) 315 lb (142.9 kg)   SpO2 98%   BMI 38.34 kg/m     Wt Readings from Last 3  Encounters:  01/21/23 (!) 315 lb (142.9 kg)  12/18/22 (!) 318 lb 9.6 oz (144.5 kg)  12/10/22 (!) 320 lb (145.2 kg)     GEN:  Well nourished, well developed in no acute distress.  Obese CARDIAC: RRR, no murmurs, rubs, gallops RESPIRATORY:  Clear to auscultation without rales, wheezing or rhonchi       ASSESSMENT AND PLAN:    1. Atrial fibrillation with RVR (HCC)   2. Persistent atrial fibrillation (HCC)   3. OSA (obstructive sleep apnea)   4. Severe obesity (BMI 35.0-39.9) with comorbidity (HCC)     #Persistent AF Dx in May 2024, s/p DCCV 12/2022.  On eliquis for stroke ppx but prefers a stroke risk mitigation strategy that avoids long-term exposure to anticoagulation.  He lives a active lifestyle and including ride motorcycles and is concerned about the elevated bleeding risk.  --------------------  I have seen Edward Obrien in the office today who is being considered for a Watchman left atrial appendage closure device. I believe they will benefit from this procedure given their history of atrial fibrillation, CHA2DS2-VASc score of 2 and unadjusted ischemic stroke rate of 2.2% per year. The patient's chart has been reviewed and I feel that they would be a candidate for short term oral anticoagulation after Watchman implant.   It is my belief that after undergoing a  LAA closure procedure, Edward Obrien will not need long term anticoagulation which eliminates anticoagulation side effects and major bleeding risk.   Procedural risks for the Watchman implant have been reviewed with the patient including a 0.5% risk of stroke, <1% risk of perforation and <1% risk of device embolization. Other risks include bleeding, vascular damage, tamponade, worsening renal function, and death. The patient understands these risk and wishes to proceed.     The published clinical data on the safety and effectiveness of WATCHMAN include but are not limited to the following: - Holmes DR, Everlene Farrier, Sick  P et al. for the PROTECT AF Investigators. Percutaneous closure of the left atrial appendage versus warfarin therapy for prevention of stroke in patients with atrial fibrillation: a randomised non-inferiority trial. Lancet 2009; 374: 534-42. Everlene Farrier, Doshi SK, Isa Rankin D et al. on behalf of the PROTECT AF Investigators. Percutaneous Left Atrial Appendage Closure for Stroke Prophylaxis in Patients With Atrial Fibrillation 2.3-Year Follow-up of the PROTECT AF (Watchman Left Atrial Appendage System for Embolic Protection in Patients With Atrial Fibrillation) Trial. Circulation 2013; 127:720-729. - Alli O, Doshi S,  Kar S, Reddy VY, Sievert H et al. Quality of Life Assessment in the Randomized PROTECT AF (Percutaneous Closure of the Left Atrial Appendage Versus Warfarin Therapy for Prevention of Stroke in Patients With Atrial Fibrillation) Trial of Patients at Risk for Stroke With Nonvalvular Atrial Fibrillation. J Am Coll Cardiol 2013; 61:1790-8. Aline August DR, Mia [place], Price M, Whisenant B, Sievert H, Doshi S, Huber K, Reddy V. Prospective randomized evaluation of the Watchman left atrial appendage Device in patients with atrial fibrillation versus long-term warfarin therapy; the PREVAIL trial. Journal of the [employer] of Cardiology, Vol. 4, No. 1, 2014, 1-11. - Kar S, Doshi SK, Sadhu A, Horton R, Osorio J et al. Primary outcome evaluation of a next-generation left atrial appendage closure device: results from the PINNACLE FLX trial. Circulation 2021;143(18)1754-1762.    After today's visit with the patient which was dedicated solely for shared decision making visit regarding LAA closure device, the patient decided to proceed with the LAA appendage closure procedure scheduled to be done in the near future at [facility]. Prior to the procedure, I would like to obtain a gated CT scan of the chest with contrast timed for PV/LA visualization.   HAS-BLED score 1 Hypertension Yes   Abnormal renal and liver function (Dialysis, transplant, Cr >2.26 mg/dL /Cirrhosis or Bilirubin >2x Normal or AST/ALT/AP >3x Normal) No  Stroke No  Bleeding No  Labile INR (Unstable/high INR) No  Elderly (>65) No  Drugs or alcohol (? 8 drinks/week, anti-plt or NSAID) No   CHA2DS2-VASc Score = 2  The patient's score is based upon: CHF History: 0 HTN History: 1 Diabetes History: 0 Stroke History: 0 Vascular Disease History: 1 Age Score: 0 Gender Score: 0    #Hypertension At goal today.  Recommend checking blood pressures 1-2 times per week at home and recording the values.  Recommend bringing these recordings to the primary care physician. Continue diltiazem and Lasix as needed.      Signed, Rossie Muskrat. Lalla Brothers, MD, [facility], [facility] 01/21/2023 11:12 AM    Electrophysiology [facility]

## 2023-01-21 NOTE — Patient Instructions (Signed)
Medication Instructions:  Your physician recommends that you continue on your current medications as directed. Please refer to the Current Medication list given to you today.  *If you need a refill on your cardiac medications before your next appointment, please call your pharmacy*  Lab Work: TODAY: BMET If you have labs (blood work) drawn today and your tests are completely normal, you will receive your results only by: MyChart Message (if you have MyChart) OR A paper copy in the mail If you have any lab test that is abnormal or we need to change your treatment, we will call you to review the results.  Testing/Procedures: Your physician has requested that you have cardiac CT. Cardiac computed tomography (CT) is a painless test that uses an x-ray machine to take clear, detailed pictures of your heart. For further information please visit [email or web address] Please follow instruction sheet as given.  Follow-Up: At [facility], you and your health needs are our priority.  As part of our continuing mission to provide you with exceptional heart care, we have created designated Provider Care Teams.  These Care Teams include your primary Cardiologist (physician) and Advanced Practice Providers (APPs -  Physician Assistants and Nurse Practitioners) who all work together to provide you with the care you need, when you need it.  Your next appointment:   You will be contacted by Nurse Navigator, Karsten Fells to schedule your pre-procedure visit and procedure date. If you have any questions she can be reached at [phone].

## 2023-01-22 LAB — BASIC METABOLIC PANEL
BUN/Creatinine Ratio: 10 (ref 9–20)
BUN: 9 mg/dL (ref 6–24)
CO2: 21 mmol/L (ref 20–29)
Calcium: 9.7 mg/dL (ref 8.7–10.2)
Chloride: 104 mmol/L (ref 96–106)
Creatinine, Ser: 0.87 mg/dL (ref 0.76–1.27)
Glucose: 85 mg/dL (ref 70–99)
Potassium: 4.1 mmol/L (ref 3.5–5.2)
Sodium: 140 mmol/L (ref 134–144)
eGFR: 103 mL/min/{1.73_m2} (ref >59)

## 2023-01-25 DIAGNOSIS — M5136 Other intervertebral disc degeneration, lumbar region: Secondary | ICD-10-CM | POA: Diagnosis not present

## 2023-01-25 DIAGNOSIS — M4124 Other idiopathic scoliosis, thoracic region: Secondary | ICD-10-CM | POA: Diagnosis not present

## 2023-01-25 DIAGNOSIS — M5134 Other intervertebral disc degeneration, thoracic region: Secondary | ICD-10-CM | POA: Diagnosis not present

## 2023-01-25 DIAGNOSIS — M9902 Segmental and somatic dysfunction of thoracic region: Secondary | ICD-10-CM | POA: Diagnosis not present

## 2023-01-27 ENCOUNTER — Telehealth (HOSPITAL_COMMUNITY): Payer: Self-pay | Admitting: *Deleted

## 2023-01-27 NOTE — Telephone Encounter (Signed)
Attempted to call patient regarding upcoming cardiac CT appointment. °Left message on voicemail with name and callback number ° °Merle Prescott RN Navigator Cardiac Imaging °[facility] °[phone] Office °[phone] Cell ° °

## 2023-01-28 ENCOUNTER — Ambulatory Visit (HOSPITAL_COMMUNITY)
Admission: RE | Admit: 2023-01-28 | Discharge: 2023-01-28 | Disposition: A | Discharge status: Home / Self Care | Payer: BC Managed Care – PPO | Source: Ambulatory Visit | Attending: Cardiology | Admitting: Cardiology

## 2023-01-28 DIAGNOSIS — M4124 Other idiopathic scoliosis, thoracic region: Secondary | ICD-10-CM | POA: Diagnosis not present

## 2023-01-28 DIAGNOSIS — M5136 Other intervertebral disc degeneration, lumbar region: Secondary | ICD-10-CM | POA: Diagnosis not present

## 2023-01-28 DIAGNOSIS — I4891 Unspecified atrial fibrillation: Secondary | ICD-10-CM

## 2023-01-28 DIAGNOSIS — G4733 Obstructive sleep apnea (adult) (pediatric): Secondary | ICD-10-CM | POA: Diagnosis not present

## 2023-01-28 DIAGNOSIS — I4819 Other persistent atrial fibrillation: Secondary | ICD-10-CM

## 2023-01-28 DIAGNOSIS — M9902 Segmental and somatic dysfunction of thoracic region: Secondary | ICD-10-CM | POA: Diagnosis not present

## 2023-01-28 DIAGNOSIS — Z4682 Encounter for fitting and adjustment of non-vascular catheter: Secondary | ICD-10-CM | POA: Diagnosis not present

## 2023-01-28 DIAGNOSIS — M5134 Other intervertebral disc degeneration, thoracic region: Secondary | ICD-10-CM | POA: Diagnosis not present

## 2023-01-28 DIAGNOSIS — I517 Cardiomegaly: Secondary | ICD-10-CM | POA: Diagnosis not present

## 2023-01-28 MED ORDER — IOHEXOL 350 MG/ML SOLN
95.0000 mL | Freq: Once | INTRAVENOUS | Status: AC | PRN
  Administered 2023-01-28: 95 mL via INTRAVENOUS

## 2023-02-02 ENCOUNTER — Telehealth: Payer: Self-pay | Admitting: Cardiology

## 2023-02-02 DIAGNOSIS — M9902 Segmental and somatic dysfunction of thoracic region: Secondary | ICD-10-CM | POA: Diagnosis not present

## 2023-02-02 DIAGNOSIS — M5136 Other intervertebral disc degeneration, lumbar region: Secondary | ICD-10-CM | POA: Diagnosis not present

## 2023-02-02 DIAGNOSIS — M5134 Other intervertebral disc degeneration, thoracic region: Secondary | ICD-10-CM | POA: Diagnosis not present

## 2023-02-02 DIAGNOSIS — M4124 Other idiopathic scoliosis, thoracic region: Secondary | ICD-10-CM | POA: Diagnosis not present

## 2023-02-02 NOTE — Telephone Encounter (Signed)
LVM to return call about potential LAAO date of 10/31? Will also need pre-LAAO visit. Can discuss and schedule this at time of phone call as well.   Georgie Chard NP-C Structural Heart Team  Pager: [phone] Phone: [phone]

## 2023-02-05 ENCOUNTER — Other Ambulatory Visit: Payer: Self-pay

## 2023-02-05 DIAGNOSIS — I4819 Other persistent atrial fibrillation: Secondary | ICD-10-CM

## 2023-02-05 NOTE — Telephone Encounter (Signed)
Confirmed with Georgie Chard she spoke with Edward Obrien 7/30 and confirmed procedure date of 05/06/2023. She scheduled him for pre-procedure date of 04/19/2023.

## 2023-02-08 ENCOUNTER — Telehealth: Payer: Self-pay

## 2023-02-08 DIAGNOSIS — M4124 Other idiopathic scoliosis, thoracic region: Secondary | ICD-10-CM | POA: Diagnosis not present

## 2023-02-08 DIAGNOSIS — M5136 Other intervertebral disc degeneration, lumbar region: Secondary | ICD-10-CM | POA: Diagnosis not present

## 2023-02-08 DIAGNOSIS — M9902 Segmental and somatic dysfunction of thoracic region: Secondary | ICD-10-CM | POA: Diagnosis not present

## 2023-02-08 DIAGNOSIS — M5134 Other intervertebral disc degeneration, thoracic region: Secondary | ICD-10-CM | POA: Diagnosis not present

## 2023-02-15 DIAGNOSIS — M5136 Other intervertebral disc degeneration, lumbar region: Secondary | ICD-10-CM | POA: Diagnosis not present

## 2023-02-15 DIAGNOSIS — M9902 Segmental and somatic dysfunction of thoracic region: Secondary | ICD-10-CM | POA: Diagnosis not present

## 2023-02-15 DIAGNOSIS — M5134 Other intervertebral disc degeneration, thoracic region: Secondary | ICD-10-CM | POA: Diagnosis not present

## 2023-02-15 DIAGNOSIS — M4124 Other idiopathic scoliosis, thoracic region: Secondary | ICD-10-CM | POA: Diagnosis not present

## 2023-02-23 DIAGNOSIS — M9902 Segmental and somatic dysfunction of thoracic region: Secondary | ICD-10-CM | POA: Diagnosis not present

## 2023-02-23 DIAGNOSIS — M5134 Other intervertebral disc degeneration, thoracic region: Secondary | ICD-10-CM | POA: Diagnosis not present

## 2023-02-23 DIAGNOSIS — M4124 Other idiopathic scoliosis, thoracic region: Secondary | ICD-10-CM | POA: Diagnosis not present

## 2023-02-23 DIAGNOSIS — M5136 Other intervertebral disc degeneration, lumbar region: Secondary | ICD-10-CM | POA: Diagnosis not present

## 2023-03-01 DIAGNOSIS — M5134 Other intervertebral disc degeneration, thoracic region: Secondary | ICD-10-CM | POA: Diagnosis not present

## 2023-03-01 DIAGNOSIS — M9902 Segmental and somatic dysfunction of thoracic region: Secondary | ICD-10-CM | POA: Diagnosis not present

## 2023-03-01 DIAGNOSIS — M4124 Other idiopathic scoliosis, thoracic region: Secondary | ICD-10-CM | POA: Diagnosis not present

## 2023-03-01 DIAGNOSIS — M5136 Other intervertebral disc degeneration, lumbar region: Secondary | ICD-10-CM | POA: Diagnosis not present

## 2023-03-08 ENCOUNTER — Other Ambulatory Visit (HOSPITAL_COMMUNITY): Payer: Self-pay | Admitting: Internal Medicine

## 2023-03-09 DIAGNOSIS — M9902 Segmental and somatic dysfunction of thoracic region: Secondary | ICD-10-CM | POA: Diagnosis not present

## 2023-03-09 DIAGNOSIS — M5134 Other intervertebral disc degeneration, thoracic region: Secondary | ICD-10-CM | POA: Diagnosis not present

## 2023-03-09 DIAGNOSIS — M5136 Other intervertebral disc degeneration, lumbar region: Secondary | ICD-10-CM | POA: Diagnosis not present

## 2023-03-09 DIAGNOSIS — M4124 Other idiopathic scoliosis, thoracic region: Secondary | ICD-10-CM | POA: Diagnosis not present

## 2023-03-22 DIAGNOSIS — M5136 Other intervertebral disc degeneration, lumbar region: Secondary | ICD-10-CM | POA: Diagnosis not present

## 2023-03-22 DIAGNOSIS — M9902 Segmental and somatic dysfunction of thoracic region: Secondary | ICD-10-CM | POA: Diagnosis not present

## 2023-03-22 DIAGNOSIS — M4124 Other idiopathic scoliosis, thoracic region: Secondary | ICD-10-CM | POA: Diagnosis not present

## 2023-03-22 DIAGNOSIS — M5134 Other intervertebral disc degeneration, thoracic region: Secondary | ICD-10-CM | POA: Diagnosis not present

## 2023-04-01 ENCOUNTER — Other Ambulatory Visit (HOSPITAL_COMMUNITY): Payer: Self-pay

## 2023-04-05 ENCOUNTER — Other Ambulatory Visit (HOSPITAL_COMMUNITY): Payer: Self-pay | Admitting: Internal Medicine

## 2023-04-05 DIAGNOSIS — M9902 Segmental and somatic dysfunction of thoracic region: Secondary | ICD-10-CM | POA: Diagnosis not present

## 2023-04-05 DIAGNOSIS — M5136 Other intervertebral disc degeneration, lumbar region: Secondary | ICD-10-CM | POA: Diagnosis not present

## 2023-04-05 DIAGNOSIS — M4124 Other idiopathic scoliosis, thoracic region: Secondary | ICD-10-CM | POA: Diagnosis not present

## 2023-04-05 DIAGNOSIS — M5134 Other intervertebral disc degeneration, thoracic region: Secondary | ICD-10-CM | POA: Diagnosis not present

## 2023-04-08 ENCOUNTER — Telehealth: Payer: Self-pay

## 2023-04-16 NOTE — Progress Notes (Addendum)
[facility]                                     Cardiology Office Note:    Date:  04/19/2023   ID:  Edward Obrien, DOB 05/01/1971, MRN [id number]  PCP:  Edward Inch, MD  [facility] Cardiologist:  None  [facility] Electrophysiologist:  Lanier Prude, MD   Referring MD: Edward Inch, MD   Chief Complaint  Patient presents with   pre LAAO    History of Present Illness:    Edward Obrien is a 52 y.o. male with a hx of obesity, non-obstructive CAD per CT, SVT, RBBB, HTN, HLD, OSA on CPAP, and atrial fibrillation who was referred to Dr. Lalla Brothers for the evaluation of LAAO with Watchman to avoid long term DOAC due to his active lifestyle with riding motorcycles.   The patient initially was found to have AF after presenting [facility] with palpitations found to be in AF with RVR. He was started on diltiazem and Eliquis and later underwent successful DCCV. After referral to Dr. Lalla Brothers, he was felt to be a good candidate and therefore underwent pre Watchman CT imaging that showed anatomy suitable to proceed.   Today he is here and states that he has been well and is tolerating medications without issues. He denies chest pain, SOB, palpitations, LE edema, orthopnea, PND, dizziness, or syncope. Denies bleeding in stool or urine.    Past Medical History:  Diagnosis Date   CAD (coronary artery disease) 10/30/2019   mild-mod by cardiac CT>>med rx   HTN (hypertension)    Hyperlipidemia LDL goal <70    OSA on CPAP 11/2019   SVT (supraventricular tachycardia) [facility]     Past Surgical History:  Procedure Laterality Date   CARDIAC CATHETERIZATION  10/30/2019   mild-mod dz>>med rx   CARDIOVERSION N/A 12/10/2022   Procedure: CARDIOVERSION;  Surgeon: Jake Bathe, MD;  Location: [facility] INVASIVE CV LAB;  Service: Cardiovascular;  Laterality: N/A;   VASECTOMY      Current Medications: Current Meds  Medication Sig   apixaban (ELIQUIS) 5 MG TABS tablet TAKE 1 TABLET BY  MOUTH 2 TIMES A DAY   aspirin-acetaminophen-caffeine (EXCEDRIN MIGRAINE) 250-250-65 MG tablet Take 2 tablets by mouth as needed for headache.   carboxymethylcellulose (REFRESH TEARS) 0.5 % SOLN Place 1 drop into both eyes daily as needed (dry eyes).   diltiazem (CARDIZEM CD) 180 MG 24 hr capsule TAKE 1 CAPSULE BY MOUTH DAILY   furosemide (LASIX) 20 MG tablet Take 1 tablet (20 mg total) by mouth as needed. PATIENT MUST SCHEDULE APPOINTMENT FOR FUTURE REFILLS FIRST ATTEMPT   nitroGLYCERIN (NITROSTAT) 0.4 MG SL tablet Place 1 tablet (0.4 mg total) under the tongue every 5 (five) minutes as needed for chest pain.   Omega 3 1200 MG CAPS Take 1,200 mg by mouth daily.   Podiatric Products (HEEL BALM EX) Apply 1 Application topically at bedtime as needed (cracked skin).     Allergies:   Patient has no known allergies.   Social History   Socioeconomic History   Marital status: Married    Spouse name: Not on file   Number of children: Not on file   Years of education: Not on file   Highest education level: Not on file  Occupational History   Not on file  Tobacco Use   Smoking status: Some Days  Types: Cigars   Smokeless tobacco: Never  Substance and Sexual Activity   Alcohol use: Never   Drug use: Never   Sexual activity: Yes  Other Topics Concern   Not on file  Social History Narrative   Not on file   Social Determinants of Health   Financial Resource Strain: Low Risk  (11/02/2022)   Received from [facility], [facility]   Overall Financial Resource Strain (CARDIA)    Difficulty of Paying Living Expenses: Not hard at all  Food Insecurity: No Food Insecurity (11/15/2022)   Hunger Vital Sign    Worried About Running Out of Food in the Last Year: Never true    Ran Out of Food in the Last Year: Never true  Transportation Needs: No Transportation Needs (11/15/2022)   PRAPARE - Administrator, Civil Service (Medical): No    Lack of Transportation (Non-Medical): No   Physical Activity: Sufficiently Active (11/02/2022)   Received from [facility], [facility]   Exercise Vital Sign    Days of Exercise per Week: 6 days    Minutes of Exercise per Session: 60 min  Stress: No Stress Concern Present (11/02/2022)   Received from [facility], [facility] - Occupational Stress Questionnaire    Feeling of Stress : Not at all  Social Connections: Socially Integrated (11/02/2022)   Received from [facility], [facility]   Social Network    How would you rate your social network (family, work, friends)?: Good participation with social networks    Family History: The patient's family history includes Alzheimer's disease in his mother; Diabetes in his mother; Failure to thrive in his father; Hypertension in his mother.  ROS:   Please see the history of present illness.    All other systems reviewed and are negative.  EKGs/Labs/Other Studies Reviewed:    The following studies were reviewed today:  Cardiac Studies & Procedures       ECHOCARDIOGRAM  ECHOCARDIOGRAM COMPLETE 11/16/2022  Narrative ECHOCARDIOGRAM REPORT    Patient Name:   Edward Obrien Date of Exam: 11/16/2022 Medical Rec #:  [id number]       Height:       75.0 in Accession #:    [id number]      Weight:       338.4 lb Date of Birth:  [birth year]        BSA:          2.744 m Patient Age:    51 years        BP:           115/70 mmHg Patient Gender: M               HR:           80 bpm. Exam Location:  Inpatient  Procedure: 2D Echo, Cardiac Doppler, Color Doppler and Intracardiac Opacification Agent  Indications:    Atrial Fibrillation  History:        Patient has prior history of Echocardiogram examinations, most recent 10/30/2019. CAD, Arrythmias:Atrial Fibrillation, Signs/Symptoms:Chest Pain and Dyspnea; Risk Factors:Sleep Apnea, Hypertension and Dyslipidemia.  Sonographer:    Wallie Char Referring Phys: [id number] SHENG L  HALEY   Sonographer Comments: Image acquisition challenging due to patient body habitus. IMPRESSIONS   1. Left ventricular ejection fraction, by estimation, is 55 to 60%. The left ventricle has normal function. The left ventricle has no regional wall motion abnormalities. The left ventricular internal cavity  size was mildly dilated. Left ventricular diastolic function could not be evaluated. 2. Right ventricular systolic function is normal. The right ventricular size is normal. 3. The mitral valve is normal in structure. No evidence of mitral valve regurgitation. No evidence of mitral stenosis. 4. The aortic valve is tricuspid. Aortic valve regurgitation is not visualized. No aortic stenosis is present. 5. Aortic dilatation noted. There is mild dilatation of the aortic root, measuring 40 mm. There is mild dilatation of the ascending aorta, measuring 40 mm.  FINDINGS Left Ventricle: Left ventricular ejection fraction, by estimation, is 55 to 60%. The left ventricle has normal function. The left ventricle has no regional wall motion abnormalities. Definity contrast agent was given IV to delineate the left ventricular endocardial borders. The left ventricular internal cavity size was mildly dilated. There is no left ventricular hypertrophy. Left ventricular diastolic function could not be evaluated due to atrial fibrillation. Left ventricular diastolic function could not be evaluated.  Right Ventricle: The right ventricular size is normal. Right ventricular systolic function is normal.  Left Atrium: Left atrial size was normal in size.  Right Atrium: Right atrial size was normal in size.  Pericardium: Trivial pericardial effusion is present.  Mitral Valve: The mitral valve is normal in structure. No evidence of mitral valve regurgitation. No evidence of mitral valve stenosis. MV peak gradient, 3.6 mmHg. The mean mitral valve gradient is 1.3 mmHg.  Tricuspid Valve: The tricuspid valve is  normal in structure. Tricuspid valve regurgitation is trivial. No evidence of tricuspid stenosis.  Aortic Valve: The aortic valve is tricuspid. Aortic valve regurgitation is not visualized. No aortic stenosis is present. Aortic valve mean gradient measures 2.0 mmHg. Aortic valve peak gradient measures 3.6 mmHg. Aortic valve area, by VTI measures 4.32 cm.  Pulmonic Valve: The pulmonic valve was not well visualized. Pulmonic valve regurgitation is not visualized. No evidence of pulmonic stenosis.  Aorta: Aortic dilatation noted. There is mild dilatation of the aortic root, measuring 40 mm. There is mild dilatation of the ascending aorta, measuring 40 mm.  Venous: The inferior vena cava was not well visualized.  IAS/Shunts: The interatrial septum was not well visualized.   LEFT VENTRICLE PLAX 2D LVIDd:         6.10 cm      Diastology LVIDs:         4.40 cm      LV e' medial:    11.20 cm/s LV PW:         1.10 cm      LV E/e' medial:  7.8 LV IVS:        1.10 cm      LV e' lateral:   11.49 cm/s LVOT diam:     2.30 cm      LV E/e' lateral: 7.6 LV SV:         76 LV SV Index:   28 LVOT Area:     4.15 cm  LV Volumes (MOD) LV vol d, MOD A2C: 220.0 ml LV vol d, MOD A4C: 220.0 ml LV vol s, MOD A2C: 103.0 ml LV vol s, MOD A4C: 103.0 ml LV SV MOD A2C:     117.0 ml LV SV MOD A4C:     220.0 ml LV SV MOD BP:      123.7 ml  RIGHT VENTRICLE             IVC RV S prime:     11.10 cm/s  IVC diam: 3.30 cm TAPSE (M-mode): 2.2  cm  LEFT ATRIUM             Index LA diam:        4.50 cm 1.64 cm/m LA Vol (A2C):   66.5 ml 24.23 ml/m LA Vol (A4C):   73.2 ml 26.68 ml/m LA Biplane Vol: 73.5 ml 26.79 ml/m AORTIC VALVE AV Area (Vmax):    4.22 cm AV Area (Vmean):   4.30 cm AV Area (VTI):     4.32 cm AV Vmax:           94.40 cm/s AV Vmean:          68.133 cm/s AV VTI:            0.175 m AV Peak Grad:      3.6 mmHg AV Mean Grad:      2.0 mmHg LVOT Vmax:         95.87 cm/s LVOT Vmean:         70.500 cm/s LVOT VTI:          0.182 m LVOT/AV VTI ratio: 1.04  AORTA Ao Root diam: 4.00 cm Ao Asc diam:  4.00 cm  MITRAL VALVE MV Area (PHT): 4.76 cm    SHUNTS MV Area VTI:   4.10 cm    Systemic VTI:  0.18 m MV Peak grad:  3.6 mmHg    Systemic Diam: 2.30 cm MV Mean grad:  1.3 mmHg MV Vmax:       0.94 m/s MV Vmean:      50.8 cm/s MV Decel Time: 159 msec MV E velocity: 87.40 cm/s  Olga Millers MD Electronically signed by Olga Millers MD Signature Date/Time: 11/16/2022/2:33:54 PM    Final     CT SCANS  CT CORONARY MORPH W/CTA COR W/SCORE 10/31/2019  Addendum 10/31/2019  1:35 PM ADDENDUM REPORT: 10/31/2019 13:30  CLINICAL DATA:  52 year old male with morbid obesity, hyperlipidemia and chest pain.  EXAM: Cardiac/Coronary  CTA  TECHNIQUE: The patient was scanned on a [employer].  FINDINGS: A 100 kV prospective scan was triggered in the descending thoracic aorta at 111 HU's. Axial non-contrast 3 mm slices were carried out through the heart. The data set was analyzed on a dedicated work station and scored using the Agatson method. Gantry rotation speed was 250 msecs and collimation was .6 mm. No beta blockade and 0.8 mg of sl NTG was given. The 3D data set was reconstructed in 5% intervals of the 67-82 % of the R-R cycle. Diastolic phases were analyzed on a dedicated work station using MPR, MIP and VRT modes. The patient received 80 cc of contrast.  Aorta: Normal size. Minimal diffuse atherosclerotic plaque, no calcifications. No dissection.  Aortic Valve:  Trileaflet.  No calcifications.  Coronary Arteries:  Normal coronary origin.  Right dominance.  RCA is a large dominant artery that gives rise to PDA and PLA. There are minimal luminal irregularities.  Left main is a large artery that gives rise to LAD and LCX arteries. Left main has no disease.  LAD is a large vessel that gives rise to a small diagonal artery. Proximal and mid LAD has  mild to moderate diffuse mixed plaque with stenosis 25-49% and possibly 50-69% in the mid portion. Distal LAD has no significant disease.  LCX is a small non-dominant artery that has no obvious plaque.  Other findings:  Normal pulmonary vein drainage into the left atrium.  Normal left atrial appendage without a thrombus.  IMPRESSION: 1. Coronary calcium score of 56. This  was 14 percentile for age and sex matched control.  2. Normal coronary origin with right dominance.  3. Study quality and interpretation affected by patient's size. CAD-RADS 3. Mild to moderate stenosis in the proximal and mid LAD. Consider symptom-guided anti-ischemic pharmacotherapy as well as aggressive risk factor modification per guideline directed care.  4. Mildly dilated pulmonary artery suggestive of pulmonary hypertension.   Electronically Signed By: Tobias Alexander On: 10/31/2019 13:30  Narrative EXAM: OVER-READ INTERPRETATION  CT CHEST  The following report is an over-read performed by radiologist Dr. Jeronimo Greaves of [facility] on 10/31/2019. This over-read does not include interpretation of cardiac or coronary anatomy or pathology. The coronary CTA interpretation by the cardiologist is attached.  COMPARISON:  Chest radiograph 10/29/2019  FINDINGS: Vascular: Normal aortic caliber. No dissection. No central pulmonary embolism, on this non-dedicated study.  Mediastinum/Nodes: No imaged thoracic adenopathy. The esophagus is mildly dilated with a fluid level within, including on 42/8.  Lungs/Pleura: No pleural fluid. Clear imaged lungs.  Upper Abdomen: Normal imaged portions of the liver, spleen, stomach.  Musculoskeletal: No acute osseous abnormality.  IMPRESSION: 1. No acute process in the chest. 2. Esophageal air fluid level suggests dysmotility or gastroesophageal reflux.  Electronically Signed: By: Jeronimo Greaves M.D. On: 10/31/2019 12:18          EKG:  EKG is  ordered today.  The ekg ordered today demonstrates sinus bradycardia with RBBB, HR 47bpm.   Recent Labs: 11/15/2022: TSH 1.948 11/16/2022: Hemoglobin 13.0; Platelets 221 01/21/2023: BUN 9; Creatinine, Ser 0.87; Potassium 4.1; Sodium 140   Recent Lipid Panel    Component Value Date/Time   CHOL 143 11/16/2022 0233   TRIG 78 11/16/2022 0233   HDL 37 (L) 11/16/2022 0233   CHOLHDL 3.9 11/16/2022 0233   VLDL 16 11/16/2022 0233   LDLCALC 90 11/16/2022 0233   Risk Assessment/Calculations:    HAS-BLED score 1 Hypertension Yes  Abnormal renal and liver function (Dialysis, transplant, Cr >2.26 mg/dL /Cirrhosis or Bilirubin >2x Normal or AST/ALT/AP >3x Normal) No  Stroke No  Bleeding No  Labile INR (Unstable/high INR) No  Elderly (>65) No  Drugs or alcohol (>= 8 drinks/week, anti-plt or NSAID) No    CHA2DS2-VASc Score = 2  The patient's score is based upon: CHF History: 0 HTN History: 1 Diabetes History: 0 Stroke History: 0 Vascular Disease History: 1 Age Score: 0 Gender Score: 0   Physical Exam:    VS:  BP 118/76   Pulse (!) 47   Ht 6\' 4"  (1.93 m)   Wt (!) 324 lb 3.2 oz (147.1 kg)   SpO2 98%   BMI 39.46 kg/m     Wt Readings from Last 3 Encounters:  04/19/23 (!) 324 lb 3.2 oz (147.1 kg)  01/21/23 (!) 315 lb (142.9 kg)  12/18/22 (!) 318 lb 9.6 oz (144.5 kg)    General: Well developed, well nourished, NAD Lungs:Clear to ausculation bilaterally. No wheezes, rales, or rhonchi. Breathing is unlabored. Cardiovascular: RRR with S1 S2. No murmurs Extremities: No edema.  Neuro: Alert and oriented. No focal deficits. No facial asymmetry. MAE spontaneously. Psych: Responds to questions appropriately with normal affect.    ASSESSMENT/PLAN:    PAF: s/p DCCV to NSR. He has done well with diltiazem and Eliquis. Referred to Dr. Lalla Brothers for consideration of LAAO with CT showing anatomy suitable to proceed. Pre LAAO instructions given and reviewed. Received CHG soap. Obtain BMET, CBC  today. Scheduled for Watchman 10/31.   Non-obstructive CAD/vascular  disease: Noted to have mild to moderate stenosis of the proximal and mid LAD with diffuse mixed plaquing on recent CT angiogram. Also noted to have diffuse atherosclerotic plaquing of the aorta making him at higher risk for further progression. Denies anginal or LE symptoms at this time. Continue current regimen.   HTN: Stable with no changes. Currently on diltiazem   OSA on CPAP: Compliant with CPAP  Obesity: Has lost >100lb over the last year with diet and exercise.   Medication Adjustments/Labs and Tests Ordered: Current medicines are reviewed at length with the patient today.  Concerns regarding medicines are outlined above.  Orders Placed This Encounter  Procedures   CBC with Differential/Platelet   Basic metabolic panel   EKG 12-Lead   No orders of the defined types were placed in this encounter.   Patient Instructions  Medication Instructions:  Your physician recommends that you continue on your current medications as directed. Please refer to the Current Medication list given to you today.  *If you need a refill on your cardiac medications before your next appointment, please call your pharmacy*  Lab Work: Your physician recommends that you have lab work today. BMET and CBC  If you have labs (blood work) drawn today and your tests are completely normal, you will receive your results only by: MyChart Message (if you have MyChart) OR A paper copy in the mail If you have any lab test that is abnormal or we need to change your treatment, we will call you to review the results.  Follow-Up: At [facility], you and your health needs are our priority.  As part of our continuing mission to provide you with exceptional heart care, we have created designated Provider Care Teams.  These Care Teams include your primary Cardiologist (physician) and Advanced Practice Providers (APPs -  Physician Assistants and  Nurse Practitioners) who all work together to provide you with the care you need, when you need it.  We recommend signing up for the patient portal called "MyChart".  Sign up information is provided on this After Visit Summary.  MyChart is used to connect with patients for Virtual Visits (Telemedicine).  Patients are able to view lab/test results, encounter notes, upcoming appointments, etc.  Non-urgent messages can be sent to your provider as well.   To learn more about what you can do with MyChart, go to [email or web address].    Your next appointment:   Will be scheduled after your procedure  Provider:   Georgie Chard, NP       Signed, Georgie Chard, NP  04/19/2023 9:10 AM    [facility]

## 2023-04-19 ENCOUNTER — Ambulatory Visit: Payer: BC Managed Care – PPO | Attending: Cardiology | Admitting: Cardiology

## 2023-04-19 VITALS — BP 118/76 | HR 47 | Ht 76.0 in | Wt 324.2 lb

## 2023-04-19 DIAGNOSIS — G4733 Obstructive sleep apnea (adult) (pediatric): Secondary | ICD-10-CM | POA: Diagnosis not present

## 2023-04-19 DIAGNOSIS — I4891 Unspecified atrial fibrillation: Secondary | ICD-10-CM | POA: Diagnosis not present

## 2023-04-19 DIAGNOSIS — I2510 Atherosclerotic heart disease of native coronary artery without angina pectoris: Secondary | ICD-10-CM | POA: Diagnosis not present

## 2023-04-19 DIAGNOSIS — Z01812 Encounter for preprocedural laboratory examination: Secondary | ICD-10-CM | POA: Diagnosis not present

## 2023-04-19 DIAGNOSIS — I10 Essential (primary) hypertension: Secondary | ICD-10-CM

## 2023-04-19 NOTE — Patient Instructions (Signed)
Medication Instructions:  Your physician recommends that you continue on your current medications as directed. Please refer to the Current Medication list given to you today.  *If you need a refill on your cardiac medications before your next appointment, please call your pharmacy*  Lab Work: Your physician recommends that you have lab work today- BMET and CBC.  If you have labs (blood work) drawn today and your tests are completely normal, you will receive your results only by: MyChart Message (if you have MyChart) OR A paper copy in the mail If you have any lab test that is abnormal or we need to change your treatment, we will call you to review the results.  Follow-Up: At [facility], you and your health needs are our priority.  As part of our continuing mission to provide you with exceptional heart care, we have created designated Provider Care Teams.  These Care Teams include your primary Cardiologist (physician) and Advanced Practice Providers (APPs -  Physician Assistants and Nurse Practitioners) who all work together to provide you with the care you need, when you need it.  We recommend signing up for the patient portal called "MyChart".  Sign up information is provided on this After Visit Summary.  MyChart is used to connect with patients for Virtual Visits (Telemedicine).  Patients are able to view lab/test results, encounter notes, upcoming appointments, etc.  Non-urgent messages can be sent to your provider as well.   To learn more about what you can do with MyChart, go to [email or web address].    Your next appointment:   Will be scheduled after your procedure.  Provider:   Georgie Chard, NP

## 2023-04-20 LAB — CBC WITH DIFFERENTIAL/PLATELET
Basophils Absolute: 0 10*3/uL (ref 0.0–0.2)
Basos: 1 %
EOS (ABSOLUTE): 0.1 10*3/uL (ref 0.0–0.4)
Eos: 2 %
Hematocrit: 42.5 % (ref 37.5–51.0)
Hemoglobin: 13.9 g/dL (ref 13.0–17.7)
Immature Grans (Abs): 0 10*3/uL (ref 0.0–0.1)
Immature Granulocytes: 0 %
Lymphocytes Absolute: 1.2 10*3/uL (ref 0.7–3.1)
Lymphs: 29 %
MCH: 29.9 pg (ref 26.6–33.0)
MCHC: 32.7 g/dL (ref 31.5–35.7)
MCV: 91 fL (ref 79–97)
Monocytes Absolute: 0.4 10*3/uL (ref 0.1–0.9)
Monocytes: 10 %
Neutrophils Absolute: 2.5 10*3/uL (ref 1.4–7.0)
Neutrophils: 58 %
Platelets: 191 10*3/uL (ref 150–450)
RBC: 4.65 x10E6/uL (ref 4.14–5.80)
RDW: 12.5 % (ref 11.6–15.4)
WBC: 4.2 10*3/uL (ref 3.4–10.8)

## 2023-04-20 LAB — BASIC METABOLIC PANEL
BUN/Creatinine Ratio: 10 (ref 9–20)
BUN: 10 mg/dL (ref 6–24)
CO2: 24 mmol/L (ref 20–29)
Calcium: 9.1 mg/dL (ref 8.7–10.2)
Chloride: 102 mmol/L (ref 96–106)
Creatinine, Ser: 1.01 mg/dL (ref 0.76–1.27)
Glucose: 104 mg/dL — ABNORMAL HIGH (ref 70–99)
Potassium: 3.9 mmol/L (ref 3.5–5.2)
Sodium: 139 mmol/L (ref 134–144)
eGFR: 88 mL/min/{1.73_m2} (ref >59)

## 2023-04-30 ENCOUNTER — Telehealth: Payer: Self-pay | Admitting: Cardiology

## 2023-04-30 NOTE — Telephone Encounter (Signed)
Received input this morning regarding insurance coverage for this patients upcoming Watchman scheduled for 10/31. Office note was updated promptly and sent to [facility] with an appeal. Waiting for feedback. Unclear at this time if a peer to peer will be required. Mr. Curling was updated and understands that we will contact him with more information as we move through this process.   Georgie Chard NP-C Structural Heart Team  Pager: [phone] Phone: [phone]

## 2023-05-04 NOTE — Progress Notes (Unsigned)
   Virtual Visit via Telephone Note   Because of Edward Obrien's co-morbid illnesses, he is at least at moderate risk for complications without adequate follow up.  This format is felt to be most appropriate for this patient at this time.  The patient did not have access to video technology/had technical difficulties with video requiring transitioning to audio format only (telephone).  All issues noted in this document were discussed and addressed.  No physical exam could be performed with this format.  Please refer to the patient's chart for his consent to telehealth for [facility].    Date:  05/04/2023   ID:  Edward Obrien, DOB 30-Apr-1971, MRN [id number] The patient was identified using 2 identifiers.  Patient Location: Home Provider Location: Office/Clinic   PCP:  Eartha Inch, MD   [facility] Providers Cardiologist:  None Electrophysiologist:  Lanier Prude, MD     Evaluation Performed:  Follow-Up Visit  Chief Complaint:  AF  History of Present Illness:    Edward Obrien is a 52 y.o. male who presents for follow-up.  I saw the patient January 21, 2019 for for his history of atrial fibrillation.  He also has obesity nonobstructive coronary artery disease, SVT, right bundle branch block, hypertension, hyperlipidemia, obstructive sleep apnea on CPAP.  At the last appointment he and his wife expressed concern about his active lifestyle and risk of bleeding.  They were interested in pursuing left atrial appendage occlusion as a stroke risk mitigation strategy.  He has a CHA2DS2-VASc of 2 and ultimately his procedure was denied by his insurance company.  Today he is doing well. Tolerating his Eliquis with minimal bleeding issues although he remains concerned about prolonged use with his active lifestyle, hobbies and social EtOH use.  Past medical, surgical, family, social histories reviewed.    ROS:   Please see the history of present illness.    All  other systems reviewed and are negative.        Objective:    Vital Signs:  There were no vitals taken for this visit.     ASSESSMENT & PLAN:    #Atrial fibrillation We again discussed the pathophysiology of his atrial fibrillation and his associated stroke risk.  I discussed his CHA2DS2-VASc of 2 which leads to a stroke risk of at least 2.2 %/year.  We discussed the risk of anticoagulation.  I discussed the data supporting left atrial appendage occlusion.  I discussed how there are clinical trial results pending that may support implanting left atrial appendage occlusion in patients such as himself.  We will keep him on our list of interested patients and if insurance guidelines change, we can contact him to discuss moving forward with implant.  In the meantime, he will continue taking Eliquis.  Follow-up with EP APP in 1 year or sooner as needed.       Time:   Today, I have spent 26 minutes with the patient with telehealth technology discussing the above problems.     Medication Adjustments/Labs and Tests Ordered: Current medicines are reviewed at length with the patient today.  Concerns regarding medicines are outlined above.   Tests Ordered: No orders of the defined types were placed in this encounter.   Medication Changes: No orders of the defined types were placed in this encounter.   Signed, Lanier Prude, MD  05/04/2023 1:42 PM    [facility]

## 2023-05-04 NOTE — Telephone Encounter (Signed)
Late entry from earlier today:  Per Dr. Lalla Brothers, cancelled 05/06/2023 Watchman procedure due to insurance denial from [place]. Scheduled the patient for telephone visit with Dr. Lalla Brothers tomorrow to discuss in further detail. While frustrated, he was grateful for call and agreed with plan.

## 2023-05-05 ENCOUNTER — Encounter: Payer: Self-pay | Admitting: Cardiology

## 2023-05-05 ENCOUNTER — Telehealth: Payer: Self-pay

## 2023-05-05 ENCOUNTER — Ambulatory Visit: Payer: BC Managed Care – PPO | Attending: Cardiology | Admitting: Cardiology

## 2023-05-05 VITALS — Ht 76.0 in

## 2023-05-05 DIAGNOSIS — I4819 Other persistent atrial fibrillation: Secondary | ICD-10-CM | POA: Diagnosis not present

## 2023-05-05 DIAGNOSIS — I2510 Atherosclerotic heart disease of native coronary artery without angina pectoris: Secondary | ICD-10-CM | POA: Diagnosis not present

## 2023-05-05 NOTE — Patient Instructions (Signed)
 Medication Instructions:  Your physician recommends that you continue on your current medications as directed. Please refer to the Current Medication list given to you today.  *If you need a refill on your cardiac medications before your next appointment, please call your pharmacy*  Follow-Up: At [facility], you and your health needs are our priority.  As part of our continuing mission to provide you with exceptional heart care, we have created designated Provider Care Teams.  These Care Teams include your primary Cardiologist (physician) and Advanced Practice Providers (APPs -  Physician Assistants and Nurse Practitioners) who all work together to provide you with the care you need, when you need it.  Your next appointment:   1 year  Provider:   You will see one of the following Advanced Practice Providers on your designated Care Team:   Francis Dowse, Charlott Holler [address]" [place], [place] Sherie Don, NP Canary Brim, NP

## 2023-05-05 NOTE — Telephone Encounter (Signed)
..   Pt understands that although there may be some limitations with this type of visit, we will take all precautions to reduce any security or privacy concerns.  Pt understands that this will be treated like an in office visit and we will file with pt's insurance, and there may be a patient responsible charge related to this service. ? ?

## 2023-05-06 ENCOUNTER — Inpatient Hospital Stay (HOSPITAL_COMMUNITY): Admit: 2023-05-06 | Payer: BC Managed Care – PPO | Admitting: Cardiology

## 2023-05-06 ENCOUNTER — Encounter (HOSPITAL_COMMUNITY): Payer: Self-pay

## 2023-05-06 SURGERY — LEFT ATRIAL APPENDAGE OCCLUSION
Anesthesia: General

## 2023-05-11 ENCOUNTER — Ambulatory Visit: Payer: BC Managed Care – PPO | Attending: Cardiovascular Disease | Admitting: Cardiovascular Disease

## 2023-05-11 ENCOUNTER — Encounter: Payer: Self-pay | Admitting: Cardiovascular Disease

## 2023-05-11 VITALS — BP 128/78 | HR 54 | Ht 76.0 in | Wt 329.6 lb

## 2023-05-11 DIAGNOSIS — G4733 Obstructive sleep apnea (adult) (pediatric): Secondary | ICD-10-CM

## 2023-05-11 DIAGNOSIS — I10 Essential (primary) hypertension: Secondary | ICD-10-CM

## 2023-05-11 DIAGNOSIS — D6869 Other thrombophilia: Secondary | ICD-10-CM

## 2023-05-11 DIAGNOSIS — I2510 Atherosclerotic heart disease of native coronary artery without angina pectoris: Secondary | ICD-10-CM | POA: Diagnosis not present

## 2023-05-11 DIAGNOSIS — I4819 Other persistent atrial fibrillation: Secondary | ICD-10-CM

## 2023-05-11 DIAGNOSIS — I872 Venous insufficiency (chronic) (peripheral): Secondary | ICD-10-CM

## 2023-05-11 DIAGNOSIS — E785 Hyperlipidemia, unspecified: Secondary | ICD-10-CM

## 2023-05-11 NOTE — Progress Notes (Signed)
Cardiology Office Note:    Date:  05/11/2023   ID:  Edward Obrien, DOB April 13, 1971, MRN [id number]  PCP:  Eartha Inch, MD  [facility] Cardiologist:  Marquail Bradwell [facility] Electrophysiologist:  Lanier Prude, MD   Referring MD: Eartha Inch, MD   Chief Complaint  Patient presents with   Atrial Fibrillation     History of Present Illness:    Edward Obrien is a 52 y.o. male with a hx of persistent atrial fibrillation requiring cardioversion, severely elevated calcium score but active CAD by recent coronary CT angiography, bifascicular block (RBBB plus LPFB), essential hypertension, obstructive sleep apnea, dyslipidemia (very low HDL), obesity.  He was diagnosed with atrial fibrillation in May of this year when he presented with RVR and palpitations.  Noted abrupt increase in heart rate with any physical activity.  Drinking caffeinated beverages clearly made his tachycardia worse.  He started on anticoagulants and rate control medication and eventually underwent cardioversion successfully 12/10/2022 (he estimates that he is actually been in atrial fibrillation for about 6 weeks total).  He has had infrequent palpitations since then without any sustained arrhythmia.  Since he is an avid motorcycle rider, he was eager to to undergo a Watchman device implantation and was actually scheduled for the procedure with Dr. Lalla Brothers, but then the procedure was canceled when it was denied by insurance since they deemed the device to be "experimental".  He is trying to appeal that decision.  He is very compliant with CPAP which has greatly increased the quality of his sleep.  He does not have exertional dyspnea, orthopnea, PND.  Since starting diltiazem he has more frequent mild lower extremity edema that usually resolves by the next morning.  He has not had any chest pain and denies syncope.  He has not had any focal neurological events.  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  He is avoiding caffeinated beverages and he is avoiding taking Excedrin.  After having made substantial improvements in his lifestyle he lost a lot of weight but has gradually gained some back.  His BMI today is 40, although he estimates that 10-12 pounds of that may be fluid retention based on his recent weight changes.  Although he has not had symptomatic bradycardia.  He has had to turn off the bradycardia alert on his smart watch since would wake him up at night reporting heart rates in the high 30s.  His most recent lipid profile showed LDL 98, HDL 37.  He he prefers not to take lipid-lowering medications.  In April 2021, he was hospitalized at [facility] with chest pain.  The coronary CT angiogram showed extensive coronary calcification, but did not show any significant obstruction, but suggested that he does have gastroesophageal reflux since there was substantial fluid in his thoracic esophagus.  A previous 48-hour Holter monitor showed an 8 beat run of SVT, but no serious bradycardia or pauses or VT.  His echocardiogram showed a normal left ventricular size and function, EF 55-60%, "grade 1 diastolic dysfunction", mildly enlarged right ventricle with normal systolic function, dilated aortic root at 4.0 cm (uncertain whether this is truly abnormal for a 6 foot 4, 350 pound gentleman).  Past Medical History:  Diagnosis Date   CAD (coronary artery disease) 10/30/2019   mild-mod by cardiac CT>>med rx   HTN (hypertension)    Hyperlipidemia LDL  goal <70    OSA on CPAP 11/2019   SVT (supraventricular tachycardia) [facility])     Past Surgical History:  Procedure Laterality Date   CARDIAC CATHETERIZATION  10/30/2019   mild-mod dz>>med rx   CARDIOVERSION N/A 12/10/2022   Procedure: CARDIOVERSION;  Surgeon: Jake Bathe, MD;  Location: [facility] INVASIVE CV LAB;  Service: Cardiovascular;  Laterality: N/A;    VASECTOMY      Current Medications: Current Meds  Medication Sig   apixaban (ELIQUIS) 5 MG TABS tablet TAKE 1 TABLET BY MOUTH 2 TIMES A DAY   diltiazem (CARDIZEM CD) 180 MG 24 hr capsule TAKE 1 CAPSULE BY MOUTH DAILY   Omega 3 1200 MG CAPS Take 1,200 mg by mouth daily.   Podiatric Products (HEEL BALM EX) Apply 1 Application topically at bedtime as needed (cracked skin).     Allergies:   Patient has no known allergies.   Social History   Socioeconomic History   Marital status: Married    Spouse name: Not on file   Number of children: Not on file   Years of education: Not on file   Highest education level: Not on file  Occupational History   Not on file  Tobacco Use   Smoking status: Some Days    Types: Cigars   Smokeless tobacco: Never  Substance and Sexual Activity   Alcohol use: Never   Drug use: Never   Sexual activity: Yes  Other Topics Concern   Not on file  Social History Narrative   Not on file   Social Determinants of Health   Financial Resource Strain: Low Risk  (11/02/2022)   Received from [facility], [facility]   Overall Financial Resource Strain (CARDIA)    Difficulty of Paying Living Expenses: Not hard at all  Food Insecurity: No Food Insecurity (11/15/2022)   Hunger Vital Sign    Worried About Running Out of Food in the Last Year: Never true    Ran Out of Food in the Last Year: Never true  Transportation Needs: No Transportation Needs (11/15/2022)   PRAPARE - Administrator, Civil Service (Medical): No    Lack of Transportation (Non-Medical): No  Physical Activity: Sufficiently Active (11/02/2022)   Received from [facility], [facility]   Exercise Vital Sign    Days of Exercise per Week: 6 days    Minutes of Exercise per Session: 60 min  Stress: No Stress Concern Present (11/02/2022)   Received from [facility], [facility] - Occupational Stress Questionnaire    Feeling of Stress :  Not at all  Social Connections: Socially Integrated (11/02/2022)   Received from [facility], [facility]   Social Network    How would you rate your social network (family, work, friends)?: Good participation with social networks     Family History: The patient's family history includes Alzheimer's disease in his mother; Diabetes in his mother; Failure to thrive in his father; Hypertension in his mother.  ROS:   Please see the history of present illness.     All other systems reviewed and are negative.  EKGs/Labs/Other Studies Reviewed:    The following studies were reviewed today: Notes from Dr. Lalla Brothers in A-fib clinic.  ECG is not ordered today. Reviewed the tracing from 04/19/2023 which shows sinus bradycardia 47 bpm, right bundle branch block, borderline right axis DV issue.    Recent Labs: 11/15/2022: TSH 1.948 04/19/2023: BUN 10; Creatinine, Ser 1.01; Hemoglobin  13.9; Platelets 191; Potassium 3.9; Sodium 139  Recent Lipid Panel    Component Value Date/Time   CHOL 143 11/16/2022 0233   TRIG 78 11/16/2022 0233   HDL 37 (L) 11/16/2022 0233   CHOLHDL 3.9 11/16/2022 0233   VLDL 16 11/16/2022 0233   LDLCALC 90 11/16/2022 0233    Physical Exam:    VS:  BP 128/78 (BP Location: Left Arm, Patient Position: Sitting, Cuff Size: Large)   Pulse (!) 54   Ht 6\' 4"  (1.93 m)   Wt (!) 329 lb 9.6 oz (149.5 kg)   SpO2 94%   BMI 40.12 kg/m     Wt Readings from Last 3 Encounters:  05/11/23 (!) 329 lb 9.6 oz (149.5 kg)  04/19/23 (!) 324 lb 3.2 oz (147.1 kg)  01/21/23 (!) 315 lb (142.9 kg)     General: Alert, oriented x3, no distress, severely obese, but appears very fit Head: no evidence of trauma, PERRL, EOMI, no exophtalmos or lid lag, no myxedema, no xanthelasma; normal ears, nose and oropharynx Neck: normal jugular venous pulsations and no hepatojugular reflux; brisk carotid pulses without delay and no carotid bruits Chest: clear to auscultation, no signs of consolidation  by percussion or palpation, normal fremitus, symmetrical and full respiratory excursions Cardiovascular: normal position and quality of the apical impulse, regular rhythm, normal first and widely split second heart sounds, no murmurs, rubs or gallops Abdomen: no tenderness or distention, no masses by palpation, no abnormal pulsatility or arterial bruits, normal bowel sounds, no hepatosplenomegaly Extremities: no clubbing, cyanosis; he has 1+ symmetrical edema limited to the retromalleolar area Neurological: grossly nonfocal Psych: Normal mood and affect   ASSESSMENT:    1. Persistent atrial fibrillation (HCC)   2. Acquired thrombophilia (HCC)   3. Coronary artery disease involving native coronary artery of native heart without angina pectoris   4. Dyslipidemia   5. OSA (obstructive sleep apnea)   6. Peripheral venous insufficiency   7. Severe obesity (BMI 35.0-39.9) with comorbidity (HCC)   8. Essential hypertension      PLAN:    In order of problems listed above:  AFib: I actually think he is an excellent candidate for Watchman device.  Since he has early onset of atrial fibrillation, he has decades of potential bleeding complications to be worried about on chronic anticoagulation.  I do not understand why his insurance company calls the procedure "experimental "although he mentioned it may be because of his young age.  I encouraged him to appeal the decision.  CHA2DS2-VASc score is 2 (HTN, CAD). CAD: He does not have angina pectoris and did not have angina even when he had heart rates in the 150/160s with atrial fibrillation.  He had a high coronary calcium score, but CT angiography showed no obstruction.  Resting bradycardia precludes use of beta-blockers.  He is no longer taking aspirin since he is now on Eliquis.  No longer requires statin after lifestyle improvements.   HLP/PreDM: Ideally would like his LDL cholesterol less than 70 and HDL greater than 45.  He wants to continue  working on this with lifestyle changes.  Most recent hemoglobin A1c was completely normal range at 5.2%. OSA: 100% compliant with CPAP with excellent improvement in sleep quality and energy level. Peripheral venous insufficiency: Ankle edema is largely related to varicose veins and he did have mild reflux on lower extremity venous study.  Suspect it is worsened with diltiazem, but the problem remains quite mild. Severe obesity: Encouraged him to continue efforts  at weight loss.  Even though some of the recent weight gain may indeed be fluid, he has backtracked with his true weight management. HTN: Well-controlled on current medications.   Medication Adjustments/Labs and Tests Ordered: Current medicines are reviewed at length with the patient today.  Concerns regarding medicines are outlined above.  No orders of the defined types were placed in this encounter.   No orders of the defined types were placed in this encounter.    Patient Instructions  Medication Instructions:  No changes *If you need a refill on your cardiac medications before your next appointment, please call your pharmacy*  Follow-Up: At [facility], you and your health needs are our priority.  As part of our continuing mission to provide you with exceptional heart care, we have created designated Provider Care Teams.  These Care Teams include your primary Cardiologist (physician) and Advanced Practice Providers (APPs -  Physician Assistants and Nurse Practitioners) who all work together to provide you with the care you need, when you need it.  We recommend signing up for the patient portal called "MyChart".  Sign up information is provided on this After Visit Summary.  MyChart is used to connect with patients for Virtual Visits (Telemedicine).  Patients are able to view lab/test results, encounter notes, upcoming appointments, etc.  Non-urgent messages can be sent to your provider as well.   To learn more about what  you can do with MyChart, go to [email or web address].    Your next appointment:   1 year(s)  Provider:   Dr Royann Shivers     Signed, Thurmon Fair, MD  05/11/2023 4:50 PM    [facility]

## 2023-05-11 NOTE — Patient Instructions (Signed)

## 2023-06-11 ENCOUNTER — Other Ambulatory Visit (HOSPITAL_COMMUNITY): Payer: Self-pay | Admitting: Internal Medicine

## 2023-10-03 ENCOUNTER — Other Ambulatory Visit (HOSPITAL_COMMUNITY): Payer: Self-pay | Admitting: Internal Medicine

## 2023-10-03 DIAGNOSIS — I4891 Unspecified atrial fibrillation: Secondary | ICD-10-CM

## 2023-10-04 NOTE — Telephone Encounter (Signed)
 Eliquis 5mg  refill request received. Patient is 53 years old, weight-149.5kg, Crea-1.01 on 04/19/23, Diagnosis-Afib, and last seen by Dr. Royann Shivers on 05/11/23. Dose is appropriate based on dosing criteria. Will send in refill to requested pharmacy.

## 2023-11-08 ENCOUNTER — Other Ambulatory Visit: Payer: Self-pay | Admitting: Cardiovascular Disease

## 2023-11-10 ENCOUNTER — Other Ambulatory Visit: Payer: Self-pay

## 2023-11-10 MED ORDER — FUROSEMIDE 20 MG PO TABS
20.0000 mg | ORAL_TABLET | ORAL | 6 refills | Status: DC | PRN
  Filled 2023-11-10: qty 30, 30d supply, fill #0

## 2023-12-21 ENCOUNTER — Other Ambulatory Visit: Payer: Self-pay

## 2024-01-31 ENCOUNTER — Encounter: Payer: Self-pay | Admitting: Emergency Medicine

## 2024-01-31 ENCOUNTER — Telehealth: Payer: Self-pay | Admitting: Emergency Medicine

## 2024-01-31 ENCOUNTER — Ambulatory Visit (INDEPENDENT_AMBULATORY_CARE_PROVIDER_SITE_OTHER)

## 2024-01-31 ENCOUNTER — Ambulatory Visit
Admission: EM | Admit: 2024-01-31 | Discharge: 2024-01-31 | Disposition: A | Discharge status: Home / Self Care | Attending: Emergency Medicine | Admitting: Emergency Medicine

## 2024-01-31 DIAGNOSIS — W19XXXA Unspecified fall, initial encounter: Secondary | ICD-10-CM

## 2024-01-31 DIAGNOSIS — M25511 Pain in right shoulder: Secondary | ICD-10-CM

## 2024-01-31 MED ORDER — PREDNISONE 20 MG PO TABS: 40.0000 mg | ORAL_TABLET | Freq: Every day | ORAL | 0 refills | Status: DC

## 2024-01-31 MED ORDER — CYCLOBENZAPRINE HCL 10 MG PO TABS: 10.0000 mg | ORAL_TABLET | Freq: Every day | ORAL | 0 refills | Status: DC

## 2024-01-31 NOTE — Discharge Instructions (Signed)
 Today you were evaluated for your shoulder pain  X-rays pending you will be notified of results via telephone  Begin prednisone  every morning with food for 5 days to reduce inflammation and help reduce pain, may continue use of Tylenol  as needed  May use muscle relaxant at bedtime as needed for additional comfort  If there is a break in the bone then you will return to the clinic for a sling to be applied for stability of support  You may continue use of ice or heat over the affected area 10 to 15-minute intervals  Whenever sitting and lying cushion the body with pillows for additional support  If there is a break in the bone then you will follow-up with orthopedics in 1 to 2 weeks, call and schedule appointment  If there is no break in the bone then you will only need to follow-up if there is no improvement past 2 weeks

## 2024-01-31 NOTE — ED Provider Notes (Addendum)
 [name]    CSN: [id number] Arrival date & time: 01/31/24  1043      History   Chief Complaint Chief Complaint  Patient presents with   Shoulder Injury    HPI Edward Obrien is a 53 y.o. male.   Patient presents for evaluation of pain to the right shoulder beginning 1 day ago after fall.  Endorses he tripped while playing with his dogs causing his shoulder to land into the jam of the door.  Pain has been constant to the top of the shoulder, does not radiate, denies numbness or tingling.  Exacerbated when the arm is extended above the head and forward.  Has attempted use of ice and Tylenol  without improvement.    Past Medical History:  Diagnosis Date   CAD (coronary artery disease) 10/30/2019   mild-mod by cardiac CT>>med rx   HTN (hypertension)    Hyperlipidemia LDL goal <70    OSA on CPAP 11/2019   SVT (supraventricular tachycardia) [facility])     Patient Active Problem List   Diagnosis Date Noted   Hypercoagulable state due to persistent atrial fibrillation (HCC) 12/02/2022   Atrial fibrillation with RVR (HCC) 11/15/2022   Hypotension 11/12/2019   Chest pain 10/30/2019   Exertional dyspnea 10/30/2019   Hyperglycemia 10/30/2019   OSA (obstructive sleep apnea)    Morbid obesity (HCC)    SVT (supraventricular tachycardia) (HCC)     Past Surgical History:  Procedure Laterality Date   CARDIAC CATHETERIZATION  10/30/2019   mild-mod dz>>med rx   CARDIOVERSION N/A 12/10/2022   Procedure: CARDIOVERSION;  Surgeon: Jeffrie Oneil BROCKS, MD;  Location: [facility] INVASIVE CV LAB;  Service: Cardiovascular;  Laterality: N/A;   VASECTOMY         Home Medications    Prior to Admission medications   Medication Sig Start Date End Date Taking? Authorizing Provider  cyclobenzaprine  (FLEXERIL ) 10 MG tablet Take 1 tablet (10 mg total) by mouth at bedtime. 01/31/24  Yes Analyse Angst R, NP  predniSONE  (DELTASONE ) 20 MG tablet Take 2 tablets (40 mg total) by mouth daily. 01/31/24  Yes  Lovelyn Sheeran, Shelba SAUNDERS, NP  aspirin -acetaminophen -caffeine  (EXCEDRIN  MIGRAINE) 250-250-65 MG tablet Take 2 tablets by mouth daily as needed for headache. Patient not taking: Reported on 05/11/2023    [provider]  carboxymethylcellulose (REFRESH TEARS) 0.5 % SOLN Place 1 drop into both eyes daily as needed (dry eyes). Patient not taking: Reported on 05/11/2023    [provider]  diltiazem  (CARDIZEM  CD) 180 MG 24 hr capsule TAKE 1 CAPSULE BY MOUTH DAILY 06/15/23   Croitoru, Mihai, MD  ELIQUIS  5 MG TABS tablet TAKE 1 TABLET BY MOUTH 2 TIMES A DAY 10/04/23   Croitoru, Mihai, MD  furosemide  (LASIX ) 20 MG tablet Take 1 tablet (20 mg total) by mouth as needed. 11/10/23   Croitoru, Mihai, MD  nitroGLYCERIN  (NITROSTAT ) 0.4 MG SL tablet Place 1 tablet (0.4 mg total) under the tongue every 5 (five) minutes as needed for chest pain. Patient not taking: Reported on 05/11/2023 10/31/19   Arrien, Mauricio Daniel, MD  Omega 3 1200 MG CAPS Take 1,200 mg by mouth daily.    [provider]  Podiatric Products (HEEL BALM EX) Apply 1 Application topically at bedtime as needed (cracked skin).    [provider]    Family History Family History  Problem Relation Age of Onset   Alzheimer's disease Mother    Hypertension Mother    Diabetes Mother    Failure  to thrive Father     Social History Social History   Tobacco Use   Smoking status: Some Days    Types: Cigars   Smokeless tobacco: Never  Substance Use Topics   Alcohol use: Never   Drug use: Never     Allergies   Patient has no known allergies.   Review of Systems Review of Systems   Physical Exam Triage Vital Signs ED Triage Vitals  Encounter Vitals Group     BP 01/31/24 1115 (!) 155/87     Girls Systolic BP Percentile --      Girls Diastolic BP Percentile --      Boys Systolic BP Percentile --      Boys Diastolic BP Percentile --      Pulse Rate 01/31/24 1115 73     Resp 01/31/24 1115 18     Temp  01/31/24 1115 98.3 F (36.8 C)     Temp Source 01/31/24 1115 Oral     SpO2 01/31/24 1115 100 %     Weight --      Height --      Head Circumference --      Peak Flow --      Pain Score 01/31/24 1113 4     Pain Loc --      Pain Education --      Exclude from Growth Chart --    No data found.  Updated Vital Signs BP (!) 155/87 (BP Location: Left Arm)   Pulse 73   Temp 98.3 F (36.8 C) (Oral)   Resp 18   SpO2 100%   Visual Acuity Right Eye Distance:   Left Eye Distance:   Bilateral Distance:    Right Eye Near:   Left Eye Near:    Bilateral Near:     Physical Exam Constitutional:      Appearance: Normal appearance.  Eyes:     Extraocular Movements: Extraocular movements intact.  Pulmonary:     Effort: Pulmonary effort is normal.  Musculoskeletal:       Arms:     Comments: Tenderness present to the superior of the right shoulder without ecchymosis swelling or deformity, limitations on abduction, 2+ brachial pulse, strength of 4 out of 5  Neurological:     Mental Status: He is alert and oriented to person, place, and time. Mental status is at baseline.      UC Treatments / Results  Labs (all labs ordered are listed, but only abnormal results are displayed) Labs Reviewed - No data to display  EKG   Radiology No results found.  Procedures Procedures (including critical care time)  Medications Ordered in UC Medications - No data to display  Initial Impression / Assessment and Plan / UC Course  I have reviewed the triage vital signs and the nursing notes.  Pertinent labs & imaging results that were available during my care of the patient were reviewed by me and considered in my medical decision making (see chart for details).  Acute pain of right shoulder, fall  X-ray pending, if fracture noted will return to clinic for sling to be applied, prescribed prednisone  and Flexeril  recommended supportive care through [facility] and given walker referral to  ophthalmology and discussed follow-up based on imaging results Final Clinical Impressions(s) / UC Diagnoses   Final diagnoses:  Fall, initial encounter  Acute pain of right shoulder     Discharge Instructions      Today you were evaluated for your shoulder pain  X-rays pending you will be notified of results via telephone  Begin prednisone  every morning with food for 5 days to reduce inflammation and help reduce pain, may continue use of Tylenol  as needed  May use muscle relaxant at bedtime as needed for additional comfort  If there is a break in the bone then you will return to the clinic for a sling to be applied for stability of support  You may continue use of ice or heat over the affected area 10 to 15-minute intervals  Whenever sitting and lying cushion the body with pillows for additional support  If there is a break in the bone then you will follow-up with orthopedics in 1 to 2 weeks, call and schedule appointment  If there is no break in the bone then you will only need to follow-up if there is no improvement past 2 weeks   ED Prescriptions     Medication Sig Dispense Auth. Provider   predniSONE  (DELTASONE ) 20 MG tablet Take 2 tablets (40 mg total) by mouth daily. 10 tablet Miracle Mongillo R, NP   cyclobenzaprine  (FLEXERIL ) 10 MG tablet Take 1 tablet (10 mg total) by mouth at bedtime. 10 tablet Zayaan Kozak R, NP      PDMP not reviewed this encounter.   Teresa Shelba SAUNDERS, NP 01/31/24 1148    Teresa Shelba SAUNDERS, [place] 01/31/24 1153

## 2024-01-31 NOTE — Telephone Encounter (Signed)
 Ordered x-ray results via telephone, 2 patient identifiers use, no change in treatment plan

## 2024-01-31 NOTE — ED Triage Notes (Signed)
 Patient reports a fall on yesterday and now complains of right shoulder pain. Rates pain 4/10. Took tylenol  for pain at 9:30 am.

## 2024-02-01 ENCOUNTER — Ambulatory Visit (HOSPITAL_COMMUNITY): Payer: Self-pay

## 2024-04-02 ENCOUNTER — Encounter: Payer: Self-pay | Admitting: Cardiovascular Disease

## 2024-04-03 ENCOUNTER — Other Ambulatory Visit: Payer: Self-pay

## 2024-04-03 ENCOUNTER — Other Ambulatory Visit (HOSPITAL_COMMUNITY): Payer: Self-pay

## 2024-04-03 ENCOUNTER — Other Ambulatory Visit: Payer: Self-pay | Admitting: *Deleted

## 2024-04-03 DIAGNOSIS — I4891 Unspecified atrial fibrillation: Secondary | ICD-10-CM

## 2024-04-03 MED ORDER — APIXABAN 5 MG PO TABS: 5.0000 mg | ORAL_TABLET | Freq: Two times a day (BID) | ORAL | 1 refills | Status: AC

## 2024-04-03 MED ORDER — APIXABAN 5 MG PO TABS
5.0000 mg | ORAL_TABLET | Freq: Two times a day (BID) | ORAL | 1 refills | Status: DC
  Filled 2024-04-03: qty 60, 30d supply, fill #0

## 2024-04-03 NOTE — Telephone Encounter (Signed)
 RX routed to [facility]

## 2024-04-03 NOTE — Telephone Encounter (Signed)
 Eliquis  5mg  refill request received. Patient is 53 years old, weight-149.5kg, Crea-1.01 on 10/14/2, Diagnosis-Afib, and last seen by Dr. Francyne on 05/14/23. Dose is appropriate based on dosing criteria. Will send in refill to requested pharmacy.    Refill was sent earlier to incorrect pharmacy by another user

## 2024-04-03 NOTE — Telephone Encounter (Signed)
 Prescription refill request for Eliquis  received. Indication: AF Last office visit: 05/11/23  CHRISTELLA Croitoru MD Scr: 1.01 on 04/19/23  Epic Age: 53 Weight: 149.5kg  Based on above findings Eliquis  5mg  twice daily is the appropriate dose.  Refill approved.

## 2024-05-12 NOTE — Progress Notes (Signed)
 Cardiology Office Note:  .   Date:  05/12/2024  ID:  Edward Obrien, DOB 1970-11-08, MRN [id number] PCP: Sophronia Ozell BROCKS, MD  [facility] Providers Cardiologist:  None Electrophysiologist:  OLE ONEIDA HOLTS, MD {  History of Present Illness: .   Edward Obrien is a 53 y.o. 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 Holts 01/21/23, planned to proceed  Ultimately LAAO was declined by his insurance  He saw Dr. Francyne 05/11/23, weight loss journey ongoing, though reported fluid retention. Also discussed that he had t turn off his bradycardia alert on his watch with reports of nocturnal HRs into the 30's He was encouraged to appeal his insurance's decision declining watchman. Edema felt 2/2 varicose veins with mild reflux on lower extremity venous study.  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 No CP, DOE No near syncope or syncope No bleeding, signs of bleeding though he does worry about that and would like to re-explore watchman > now with a different insurance program via his employer. Mentions some back pain, usually lower back though often feels like his whole back hurst, more noted upon waking > settles down as he is up and around/loosens up. Not changing, no trauma, not new  He feels palpitations that are somewhat reminiscent of the Afib he had last year THEN: his watch woke him with an alert to AFib > he did feel palpitations with it, very aware that his heart was not beating normally > lasted hours eventually went to the ER Since his subsequent DCCV, reports ~ 1-2x/mo  palpitations that feel similar though only last 1-2 minutes He makes watch recordings that usually tell him it is a  poor recording.  He is good about using CPAP > etions since using it his migranes have gone away  Arrhythmia/AAD hx Afib found May 2024  Studies Reviewed: SABRA    EKG done today and reviewed by myself:  SR 76bpm, RBBB   01/28/23: cardiac CT IMPRESSION: 1. Chicken wing morphology of left atrial appendage with landing zone measuring 18mm x 16mm in diameter with depth 13mm, suitable for placement of a 24mm FLX device   2. There is no thrombus in the left atrial appendage.   3. Coronary calcium score 391 (96th percentile)   11/16/22: TTE 1. Left ventricular ejection fraction, by estimation, is 55 to 60%. The  left ventricle has normal function. The left ventricle has no regional  wall motion abnormalities. The left ventricular internal cavity size was  mildly dilated. Left ventricular  diastolic function could not be evaluated.   2. Right ventricular systolic function is normal. The right ventricular  size is normal.   3. The mitral valve is normal in structure. No evidence of mitral valve  regurgitation. No evidence of mitral stenosis.   4. The aortic valve is tricuspid. Aortic valve regurgitation is not  visualized. No aortic stenosis is present.   5. Aortic dilatation noted. There is mild dilatation of the aortic root,  measuring 40 mm. There is mild dilatation of the ascending aorta,  measuring 40 mm.  Risk Assessment/Calculations:    Physical Exam:   VS:  There were no vitals taken for this visit.   Wt Readings from Last 3 Encounters:  05/11/23 (!) 329 lb 9.6 oz (149.5 kg)  04/19/23 (!) 324 lb 3.2 oz (147.1 kg)  01/21/23 (!) 315 lb (142.9 kg)    GEN: Well nourished, well developed in no acute distress NECK: No JVD; No carotid bruits CARDIAC: RRR, no murmurs, rubs, gallops RESPIRATORY:  CTA b/l without rales, wheezing or rhonchi  ABDOMEN: Soft,  non-tender, non-distended EXTREMITIES: trace-1+ edema; No deformity   ASSESSMENT AND PLAN: .    persistent AFib CHA2DS2Vasc is 2, on eliquis , appropriately dosed low burden by symptoms Labs today  HTN Looks ok  Secondary hypercoagulable state 2/2 AFib    He would like to get of OAC given active lifestyle, rides motorcycle, concerns of bleeding Would also be open to the idea of ablation  Dispo: will have him see Dr. Kennyth at next available visit/consult to discuss further  Signed, Charlies Macario Arthur, PA-C

## 2024-05-17 ENCOUNTER — Ambulatory Visit: Attending: Student in an Organized Health Care Education/Training Program | Admitting: Physician Assistant

## 2024-05-17 VITALS — BP 128/74 | HR 76 | Ht 76.0 in | Wt 368.0 lb

## 2024-05-17 DIAGNOSIS — I4819 Other persistent atrial fibrillation: Secondary | ICD-10-CM

## 2024-05-17 DIAGNOSIS — D6869 Other thrombophilia: Secondary | ICD-10-CM | POA: Diagnosis not present

## 2024-05-17 DIAGNOSIS — I10 Essential (primary) hypertension: Secondary | ICD-10-CM

## 2024-05-17 DIAGNOSIS — I4891 Unspecified atrial fibrillation: Secondary | ICD-10-CM | POA: Diagnosis not present

## 2024-05-17 NOTE — Patient Instructions (Signed)
 Medication Instructions:   Your physician recommends that you continue on your current medications as directed. Please refer to the Current Medication list given to you today.  *If you need a refill on your cardiac medications before your next appointment, please call your pharmacy*  Lab Work: NONE ORDERED  TODAY   If you have labs (blood work) drawn today and your tests are completely normal, you will receive your results only by: MyChart Message (if you have MyChart) OR A paper copy in the mail If you have any lab test that is abnormal or we need to change your treatment, we will call you to review the results.  Testing/Procedures: NONE ORDERED  TODAY    Follow-Up: At [facility], you and your health needs are our priority.  As part of our continuing mission to provide you with exceptional heart care, our providers are all part of one team.  This team includes your primary Cardiologist (physician) and Advanced Practice Providers or APPs (Physician Assistants and Nurse Practitioners) who all work together to provide you with the care you need, when you need it.  Your next appointment:  NEXT AVAILABLE FOR ABLATION AND WATCHMAN DISCUSSION    Provider:   Fonda Kitty, MD    We recommend signing up for the patient portal called MyChart.  Sign up information is provided on this After Visit Summary.  MyChart is used to connect with patients for Virtual Visits (Telemedicine).  Patients are able to view lab/test results, encounter notes, upcoming appointments, etc.  Non-urgent messages can be sent to your provider as well.   To learn more about what you can do with MyChart, go to [email or web address].   Other Instructions

## 2024-05-18 ENCOUNTER — Ambulatory Visit: Payer: Self-pay | Admitting: Physician Assistant

## 2024-05-18 LAB — BASIC METABOLIC PANEL WITH GFR
BUN/Creatinine Ratio: 18 (ref 9–20)
BUN: 17 mg/dL (ref 6–24)
CO2: 23 mmol/L (ref 20–29)
Calcium: 9.4 mg/dL (ref 8.7–10.2)
Chloride: 104 mmol/L (ref 96–106)
Creatinine, Ser: 0.95 mg/dL (ref 0.76–1.27)
Glucose: 84 mg/dL (ref 70–99)
Potassium: 4.5 mmol/L (ref 3.5–5.2)
Sodium: 139 mmol/L (ref 134–144)
eGFR: 95 mL/min/{1.73_m2}

## 2024-05-18 LAB — CBC
Hematocrit: 43.8 % (ref 37.5–51.0)
Hemoglobin: 14.5 g/dL (ref 13.0–17.7)
MCH: 31.4 pg (ref 26.6–33.0)
MCHC: 33.1 g/dL (ref 31.5–35.7)
MCV: 95 fL (ref 79–97)
Platelets: 204 10*3/uL (ref 150–450)
RBC: 4.62 x10E6/uL (ref 4.14–5.80)
RDW: 12 % (ref 11.6–15.4)
WBC: 7.1 10*3/uL (ref 3.4–10.8)

## 2024-06-07 NOTE — Progress Notes (Signed)
 Electrophysiology Office Note:   Date:  06/10/2024  ID:  Edward Obrien, DOB 11-25-1970, MRN [id number]  Primary Cardiologist: None Electrophysiologist: Fonda Kitty, MD      History of Present Illness:   Edward Obrien is a 52 y.o. male with h/o HTN, HLD, OSA on CPAP, obesity, RBBB, and persistent atrial fibrillation who is being seen today for evaluation for catheter ablation and Watchman implant.  Discussed the use of AI scribe software for clinical note transcription with the patient, who gave verbal consent to proceed.  History of Present Illness Edward Obrien is a 53 year old male with atrial fibrillation who presents for evaluation of a Watchman device and ablation procedure. He was referred by Dr. Cindie for consideration of a Watchman device and ablation procedure.  He has a history of atrial fibrillation, initially detected by his watch. Since undergoing cardioversion, he has not experienced episodes that wake him up or are detected as atrial fibrillation by his watch. However, after significant physical exertion, his heart rate readings are erratic, taking about an hour to settle down. Occasionally, post-exercise readings show a normal rhythm but with a higher heart rate.  He is concerned about being on blood thinners due to his lifestyle, which includes activities like woodworking and riding a motorcycle, both of which pose a risk of bleeding. He experiences frequent cuts and scrapes during woodworking that result in excessive bleeding.  Otherwise doing relatively well. No new or acute complaints.   Review of systems complete and found to be negative unless listed in HPI.   EP Information / Studies Reviewed:    EKG is not ordered today. EKG from 05/17/24 reviewed which showed SR with RBBB, PR and QRS .     ECG 12/02/22: AF   Echo 11/16/22:  1. Left ventricular ejection fraction, by estimation, is 55 to 60%. The  left ventricle has normal function. The left  ventricle has no regional  wall motion abnormalities. The left ventricular internal cavity size was  mildly dilated. Left ventricular  diastolic function could not be evaluated.   2. Right ventricular systolic function is normal. The right ventricular  size is normal.   3. The mitral valve is normal in structure. No evidence of mitral valve  regurgitation. No evidence of mitral stenosis.   4. The aortic valve is tricuspid. Aortic valve regurgitation is not  visualized. No aortic stenosis is present.   5. Aortic dilatation noted. There is mild dilatation of the aortic root,  measuring 40 mm. There is mild dilatation of the ascending aorta,  measuring 40 mm.   Coronary CTA 10/31/19: IMPRESSION: 1. Coronary calcium score of 56. This was 17 percentile for age and sex matched control.   2. Normal coronary origin with right dominance.   3. Study quality and interpretation affected by patient's size. CAD-RADS 3. Mild to moderate stenosis in the proximal and mid LAD. Consider symptom-guided anti-ischemic pharmacotherapy as well as aggressive risk factor modification per guideline directed care.   4. Mildly dilated pulmonary artery suggestive of pulmonary hypertension.  Risk Assessment/Calculations:    CHA2DS2-VASc Score = 2   This indicates a 2.2% annual risk of stroke. The patient's score is based upon: CHF History: 0 HTN History: 1 Diabetes History: 0 Stroke History: 0 Vascular Disease History: 1 Age Score: 0 Gender Score: 0         Physical Exam:   VS:  BP 110/74 (BP Location: Left Arm, Patient Position: Sitting, Cuff Size: Large)  Pulse (!) 50   Ht 6' 4 (1.93 m)   Wt (!) 360 lb (163.3 kg)   SpO2 98%   BMI 43.82 kg/m    Wt Readings from Last 3 Encounters:  06/08/24 (!) 360 lb (163.3 kg)  05/17/24 (!) 368 lb (166.9 kg)  05/11/23 (!) 329 lb 9.6 oz (149.5 kg)     General: Well developed, in no acute distress.  Neck: No JVD.  Cardiac: Normal rate, regular rhythm.   Resp: Normal work of breathing.  Ext: No edema.  Neuro: No gross focal deficits.  Psych: Normal affect.    ASSESSMENT AND PLAN:    #Persistent atrial fibrillation: Symptomatic. Previously required hospital admission and DCCV.  -Discussed treatment options today for AF including antiarrhythmic drug therapy and ablation. Discussed risks, recovery and likelihood of success with each treatment strategy. Risk, benefits, and alternatives to EP study and ablation for afib were discussed. These risks include but are not limited to stroke, bleeding, vascular damage, tamponade, perforation, damage to the esophagus, lungs, phrenic nerve and other structures, pulmonary vein stenosis, worsening renal function, coronary vasospasm and death.  Discussed potential need for repeat ablation procedures and antiarrhythmic drugs after an initial ablation. The patient understands these risk and wishes to proceed.  We will therefore proceed with catheter ablation at the next available time.  Carto, ICE, anesthesia are requested for the procedure.   We will not obtain CT PV protocol prior to the procedure. He has a CT from 01/28/23 which can be used.  - Continue diltiazem  180 mg once daily.  #OSA: -Encouraged CPAP.  #Hypertension -At goal today.  Recommend checking blood pressures 1-2 times per week at home and recording the values.  Recommend bringing these recordings to the primary care physician.  #Hypercoagulable state due to AF: - Continue Eliquis  5 mg twice daily.  Given plans to pursue catheter ablation as above, we will also pursue concomitant watchman implantation at that time.  I have seen Edward Obrien in the office today who is being considered for a Watchman left atrial appendage closure device. I believe they will benefit from this procedure given their history of atrial fibrillation, CHA2DS2-VASc score of 2 and unadjusted ischemic stroke rate of 2.2% per year. Unfortunately, the patient is not felt to  be a long term anticoagulation candidate secondary to bleeding, high risk activities such as woodworking and motorcycle riding and patient preference. The patient's chart has been reviewed and I feel that they would be a candidate for short term oral anticoagulation after Watchman implant.   It is my belief that after undergoing a LAA closure procedure, Edward Obrien will not need long term anticoagulation which eliminates anticoagulation side effects and major bleeding risk.   Procedural risks for the Watchman implant have been reviewed with the patient including a 0.5% risk of stroke, <1% risk of perforation and <1% risk of device embolization. Other risks include bleeding, vascular damage, tamponade, worsening renal function, and death. The patient understands these risk and wishes to proceed.     The published clinical data on the safety and effectiveness of WATCHMAN include but are not limited to the following: - Holmes DR, Jess BEARD, Sick P et al. for the PROTECT AF Investigators. Percutaneous closure of the left atrial appendage versus warfarin therapy for prevention of stroke in patients with atrial fibrillation: a randomised non-inferiority trial. Lancet 2009; 374: 534-42. GLENWOOD Jess BEARD, Doshi SK, Jonita VEAR Satchel D et al. on behalf of the PROTECT AF Investigators.  Percutaneous Left Atrial Appendage Closure for Stroke Prophylaxis in Patients With Atrial Fibrillation 2.3-Year Follow-up of the PROTECT AF (Watchman Left Atrial Appendage System for Embolic Protection in Patients With Atrial Fibrillation) Trial. Circulation 2013; 127:720-729. - Alli O, Doshi S,  Kar S, Reddy VY, Sievert H et al. Quality of Life Assessment in the Randomized PROTECT AF (Percutaneous Closure of the Left Atrial Appendage Versus Warfarin Therapy for Prevention of Stroke in Patients With Atrial Fibrillation) Trial of Patients at Risk for Stroke With Nonvalvular Atrial Fibrillation. J Am Coll Cardiol 2013; 61:1790-8. GLENWOOD Satchel  DR, Archer RAMAN, Price M, Whisenant B, Sievert H, Doshi S, Huber K, Reddy V. Prospective randomized evaluation of the Watchman left atrial appendage Device in patients with atrial fibrillation versus long-term warfarin therapy; the PREVAIL trial. Journal of the [employer] of Cardiology, Vol. 4, No. 1, 2014, 1-11. - Kar S, Doshi SK, Sadhu A, Horton R, Osorio J et al. Primary outcome evaluation of a next-generation left atrial appendage closure device: results from the PINNACLE FLX trial. Circulation 2021;143(18)1754-1762.    After today's visit with the patient which was dedicated solely for shared decision making visit regarding LAA closure device, the patient decided to proceed with the LAA appendage closure procedure scheduled to be done in the near future at [facility]. Prior to the procedure, I would like to obtain a gated CT scan of the chest with contrast timed for PV/LA visualization.   HAS-BLED score 2 Hypertension Yes  Abnormal renal and liver function (Dialysis, transplant, Cr >2.26 mg/dL /Cirrhosis or Bilirubin >2x Normal or AST/ALT/AP >3x Normal) No  Stroke No  Bleeding Yes  Labile INR (Unstable/high INR) No  Elderly (>65) No  Drugs or alcohol (>= 8 drinks/week, anti-plt or NSAID) No   CHA2DS2-VASc Score = 2  The patient's score is based upon: CHF History: 0 HTN History: 1 Diabetes History: 0 Stroke History: 0 Vascular Disease History: 1 Age Score: 0 Gender Score: 0       Follow up with Dr. Kennyth 3 months after concomitant AF ablation and watchman implant.   Signed, Fonda Kennyth, MD

## 2024-06-08 ENCOUNTER — Ambulatory Visit: Attending: Cardiology | Admitting: Cardiology

## 2024-06-08 ENCOUNTER — Encounter: Payer: Self-pay | Admitting: Cardiology

## 2024-06-08 VITALS — BP 110/74 | HR 50 | Ht 76.0 in | Wt 360.0 lb

## 2024-06-08 DIAGNOSIS — D6869 Other thrombophilia: Secondary | ICD-10-CM | POA: Diagnosis not present

## 2024-06-08 DIAGNOSIS — I4819 Other persistent atrial fibrillation: Secondary | ICD-10-CM | POA: Diagnosis not present

## 2024-06-08 DIAGNOSIS — G4733 Obstructive sleep apnea (adult) (pediatric): Secondary | ICD-10-CM

## 2024-06-08 DIAGNOSIS — I10 Essential (primary) hypertension: Secondary | ICD-10-CM | POA: Diagnosis not present

## 2024-06-08 NOTE — Patient Instructions (Addendum)
 Medication Instructions:  Your physician recommends that you continue on your current medications as directed. Please refer to the Current Medication list given to you today.  *If you need a refill on your cardiac medications before your next appointment, please call your pharmacy*  Lab Work: BMET and CBC - please have these drawn about 2 weeks prior to your procedure. You can go to any [facility] location.    Testing/Procedures: Ablation Your physician has recommended that you have an ablation. Catheter ablation is a medical procedure used to treat some cardiac arrhythmias (irregular heartbeats). During catheter ablation, a long, thin, flexible tube is put into a blood vessel in your groin (upper thigh), or neck. This tube is called an ablation catheter. It is then guided to your heart through the blood vessel. Radio frequency waves destroy small areas of heart tissue where abnormal heartbeats may cause an arrhythmia to start.  Watchman  Your physician has requested that you have Left atrial appendage (LAA) closure device implantation is a procedure to put a small device in the LAA of the heart. The LAA is a small sac in the wall of the heart's left upper chamber. Blood clots can form in this area. The device, Watchman closes the LAA to help prevent a blood clot and stroke.   You will be scheduled for Monday, January 12th. You will be contacted by Nurse Navigator, Danielle with further instructions.  Follow-Up: At [facility], you and your health needs are our priority.  As part of our continuing mission to provide you with exceptional heart care, our providers are all part of one team.  This team includes your primary Cardiologist (physician) and Advanced Practice Providers or APPs (Physician Assistants and Nurse Practitioners) who all work together to provide you with the care you need, when you need it.

## 2024-06-16 ENCOUNTER — Other Ambulatory Visit: Payer: Self-pay

## 2024-06-16 ENCOUNTER — Telehealth: Payer: Self-pay

## 2024-06-16 DIAGNOSIS — I4819 Other persistent atrial fibrillation: Secondary | ICD-10-CM

## 2024-06-16 DIAGNOSIS — I10 Essential (primary) hypertension: Secondary | ICD-10-CM

## 2024-06-16 DIAGNOSIS — D6869 Other thrombophilia: Secondary | ICD-10-CM

## 2024-06-16 DIAGNOSIS — G4733 Obstructive sleep apnea (adult) (pediatric): Secondary | ICD-10-CM

## 2024-06-16 NOTE — Telephone Encounter (Signed)
 Spoke with patient to arrange concomitant PVI/LAAO with Dr. Kennyth. Scheduled 07/31/2024 at 10 with arrival time of 0730.   Explained to patient will not bring back for pre-procedure visit as he was just seen 12/4 by Dr. Kennyth. Patient aware to notify office if he develops symptoms of illness, chest pain, SOB, has any hospitalizations/urgent care visits.   Explained instruction letter will be sent via MyChart. He will need repeat labs.

## 2024-06-18 ENCOUNTER — Other Ambulatory Visit: Payer: Self-pay | Admitting: Cardiovascular Disease

## 2024-07-18 LAB — BASIC METABOLIC PANEL WITH GFR
BUN/Creatinine Ratio: 15 (ref 9–20)
BUN: 21 mg/dL (ref 6–24)
CO2: 21 mmol/L (ref 20–29)
Calcium: 9.4 mg/dL (ref 8.7–10.2)
Chloride: 102 mmol/L (ref 96–106)
Creatinine, Ser: 1.4 mg/dL — AB (ref 0.76–1.27)
Glucose: 93 mg/dL (ref 70–99)
Potassium: 4.1 mmol/L (ref 3.5–5.2)
Sodium: 140 mmol/L (ref 134–144)
eGFR: 59 mL/min/1.73 — AB

## 2024-07-18 LAB — CBC
Hematocrit: 43.5 % (ref 37.5–51.0)
Hemoglobin: 14.7 g/dL (ref 13.0–17.7)
MCH: 31.6 pg (ref 26.6–33.0)
MCHC: 33.8 g/dL (ref 31.5–35.7)
MCV: 94 fL (ref 79–97)
Platelets: 214 x10E3/uL (ref 150–450)
RBC: 4.65 x10E6/uL (ref 4.14–5.80)
RDW: 12.3 % (ref 11.6–15.4)
WBC: 7.1 x10E3/uL (ref 3.4–10.8)

## 2024-07-23 ENCOUNTER — Ambulatory Visit: Payer: Self-pay | Admitting: Cardiology

## 2024-07-24 ENCOUNTER — Other Ambulatory Visit: Payer: Self-pay

## 2024-07-24 NOTE — Telephone Encounter (Signed)
 Opened in error

## 2024-07-25 ENCOUNTER — Telehealth: Payer: Self-pay

## 2024-07-25 NOTE — Telephone Encounter (Signed)
 Patient returned call.  Confirmed procedure date of 07/31/2024. Confirmed arrival time of 0730 for procedure time at 1000. Reviewed pre-procedure instructions with patient. Contrast allergy? No PPM or defibrillator? No Patient has been staying hydrated as he is aware of increase in kidney function. He is aware this will be rechecked DOS. The patient understands to call if questions/concerns arise prior to procedure. The patient was grateful for call and agreed with plan.

## 2024-07-25 NOTE — Telephone Encounter (Signed)
 Patient scheduled for concomitant PVI/LAAO 07/31/24 with Dr. Kennyth. Left message for him to return call to review pre-procedural instructions.

## 2024-07-26 ENCOUNTER — Encounter (HOSPITAL_COMMUNITY): Payer: Self-pay | Admitting: Cardiology

## 2024-07-28 NOTE — Telephone Encounter (Signed)
 Left message for patient to return call to confirm he still plans on coming for procedure Monday with incoming weather.

## 2024-07-28 NOTE — Telephone Encounter (Signed)
 Spoke with patient. He plans on coming in. He states he has a 4 wheel drive vehicle and is used to winter weather. Provided patient with main office number to call and cancel if needed.

## 2024-07-30 NOTE — Pre-Procedure Instructions (Signed)
 Called patient to inform him that they are canceling his procedure tomorrow.  Someone will be in touch with him next week to get rescheduled.

## 2024-07-31 ENCOUNTER — Inpatient Hospital Stay (HOSPITAL_COMMUNITY): Admission: RE | Admit: 2024-07-31 | Admitting: Cardiology

## 2024-08-02 ENCOUNTER — Telehealth: Payer: Self-pay

## 2024-08-02 DIAGNOSIS — I4819 Other persistent atrial fibrillation: Secondary | ICD-10-CM

## 2024-08-02 NOTE — Telephone Encounter (Signed)
 Spoke with patient to rescheduled concomitant ablation/watchman for 3/3. Arrival time 10:00 AM with procedure time 12:30 PM. Patient agreeable. He is aware he will require new labs within 30 days of procedure time. Will send him updated letter via MyChart.   Auth was updated with new DOS per Alan Pho.   Patient is aware to call office if he develops any symptoms, illness, goes to ER/urgent care prior to his procedure date.

## 2024-08-08 ENCOUNTER — Encounter: Payer: Self-pay | Admitting: Cardiovascular Disease

## 2024-08-08 ENCOUNTER — Ambulatory Visit: Admitting: Cardiovascular Disease

## 2024-08-08 VITALS — BP 122/70 | HR 68 | Ht 76.0 in | Wt 370.9 lb

## 2024-08-08 DIAGNOSIS — G4733 Obstructive sleep apnea (adult) (pediatric): Secondary | ICD-10-CM | POA: Diagnosis not present

## 2024-08-08 DIAGNOSIS — I2510 Atherosclerotic heart disease of native coronary artery without angina pectoris: Secondary | ICD-10-CM | POA: Diagnosis not present

## 2024-08-08 DIAGNOSIS — D6869 Other thrombophilia: Secondary | ICD-10-CM | POA: Diagnosis not present

## 2024-08-08 DIAGNOSIS — I10 Essential (primary) hypertension: Secondary | ICD-10-CM | POA: Diagnosis not present

## 2024-08-08 DIAGNOSIS — I4819 Other persistent atrial fibrillation: Secondary | ICD-10-CM

## 2024-08-08 DIAGNOSIS — E785 Hyperlipidemia, unspecified: Secondary | ICD-10-CM

## 2024-08-08 DIAGNOSIS — I872 Venous insufficiency (chronic) (peripheral): Secondary | ICD-10-CM

## 2024-08-08 DIAGNOSIS — R7303 Prediabetes: Secondary | ICD-10-CM | POA: Diagnosis not present

## 2024-08-08 LAB — LIPID PANEL
Chol/HDL Ratio: 5.7 ratio — ABNORMAL HIGH (ref 0.0–5.0)
Cholesterol, Total: 205 mg/dL — ABNORMAL HIGH (ref 100–199)
HDL: 36 mg/dL — ABNORMAL LOW
LDL Chol Calc (NIH): 137 mg/dL — ABNORMAL HIGH (ref 0–99)
Triglycerides: 179 mg/dL — ABNORMAL HIGH (ref 0–149)
VLDL Cholesterol Cal: 32 mg/dL (ref 5–40)

## 2024-08-09 ENCOUNTER — Ambulatory Visit: Payer: Self-pay | Admitting: Cardiovascular Disease

## 2024-08-09 DIAGNOSIS — E785 Hyperlipidemia, unspecified: Secondary | ICD-10-CM

## 2024-08-09 MED ORDER — ROSUVASTATIN CALCIUM 20 MG PO TABS: 20.0000 mg | ORAL_TABLET | Freq: Every day | ORAL | 3 refills | Status: AC

## 2024-08-31 ENCOUNTER — Ambulatory Visit (HOSPITAL_COMMUNITY): Admitting: Internal Medicine

## 2024-09-05 ENCOUNTER — Encounter (HOSPITAL_COMMUNITY): Admission: RE | Payer: Self-pay | Source: Home / Self Care

## 2024-10-06 ENCOUNTER — Ambulatory Visit (HOSPITAL_COMMUNITY): Admitting: Physician Assistant
